# Patient Record
Sex: Male | Born: 1975 | Race: White | Hispanic: No | Marital: Single | Smoking: Former smoker
Health system: Southern US, Community
[De-identification: ages and names within clinical notes are randomized; demographics above are authoritative.]

## PROBLEM LIST (undated history)

## (undated) DIAGNOSIS — Z789 Other specified health status: Secondary | ICD-10-CM

## (undated) DIAGNOSIS — Z0282 Encounter for adoption services: Secondary | ICD-10-CM

## (undated) DIAGNOSIS — J45909 Unspecified asthma, uncomplicated: Secondary | ICD-10-CM

## (undated) DIAGNOSIS — K219 Gastro-esophageal reflux disease without esophagitis: Secondary | ICD-10-CM

## (undated) HISTORY — PX: MOUTH SURGERY: SHX715

---

## 2011-10-04 ENCOUNTER — Ambulatory Visit: Payer: Self-pay | Admitting: Family Medicine

## 2013-08-27 ENCOUNTER — Ambulatory Visit: Payer: Self-pay | Admitting: Family Medicine

## 2015-11-04 ENCOUNTER — Other Ambulatory Visit (HOSPITAL_COMMUNITY): Payer: Self-pay | Admitting: Physician Assistant

## 2015-11-04 ENCOUNTER — Ambulatory Visit
Admission: RE | Admit: 2015-11-04 | Discharge: 2015-11-04 | Disposition: A | Discharge status: Home / Self Care | Payer: BLUE CROSS/BLUE SHIELD | Source: Ambulatory Visit | Attending: Physician Assistant | Admitting: Physician Assistant

## 2015-11-04 ENCOUNTER — Other Ambulatory Visit: Payer: Self-pay | Admitting: Physician Assistant

## 2015-11-04 DIAGNOSIS — I861 Scrotal varices: Secondary | ICD-10-CM | POA: Diagnosis not present

## 2015-11-04 DIAGNOSIS — N5089 Other specified disorders of the male genital organs: Secondary | ICD-10-CM

## 2015-12-09 ENCOUNTER — Encounter
Admission: RE | Admit: 2015-12-09 | Discharge: 2015-12-09 | Disposition: A | Discharge status: Home / Self Care | Payer: BLUE CROSS/BLUE SHIELD | Source: Ambulatory Visit | Attending: Surgery | Admitting: Surgery

## 2015-12-09 HISTORY — DX: Unspecified asthma, uncomplicated: J45.909

## 2015-12-09 HISTORY — DX: Encounter for adoption services: Z02.82

## 2015-12-09 HISTORY — DX: Gastro-esophageal reflux disease without esophagitis: K21.9

## 2015-12-09 HISTORY — DX: Other specified health status: Z78.9

## 2015-12-09 NOTE — Patient Instructions (Signed)
  Your procedure is scheduled on: 12-16-15 Report to Same Day Surgery 2nd floor medical mall [facility](Medical Mall Entrance-take elevator on left to 2nd floor.  Check in with surgery information desk.) To find out your arrival time please call [phone] between 1PM - 3PM on 12-15-15  Remember: Instructions that are not followed completely may result in serious medical risk, up to and including death, or upon the discretion of your surgeon and anesthesiologist your surgery may need to be rescheduled.    _x___ 1. Do not eat food or drink liquids after midnight. No gum chewing or hard candies.     __x__ 2. No Alcohol for 24 hours before or after surgery.   __x__3. No Smoking for 24 prior to surgery.   ____  4. Bring all medications with you on the day of surgery if instructed.    __x__ 5. Notify your doctor if there is any change in your medical condition     (cold, fever, infections).     Do not wear jewelry, make-up, hairpins, clips or nail polish.  Do not wear lotions, powders, or perfumes. You may wear deodorant.  Do not shave 48 hours prior to surgery. Men may shave face and neck.  Do not bring valuables to the hospital.    [facility] is not responsible for any belongings or valuables.               Contacts, dentures or bridgework may not be worn into surgery.  Leave your suitcase in the car. After surgery it may be brought to your room.  For patients admitted to the hospital, discharge time is determined by your treatment team.   Patients discharged the day of surgery will not be allowed to drive home.  You will need someone to drive you home and stay with you the night of your procedure.    Please read over the following fact sheets that you were given:   [facility] Preparing for Surgery and or MRSA Information   ____ Take these medicines the morning of surgery with A SIP OF WATER:    1. NONE  2.  3.  4.  5.  6.  ____Fleets enema or Magnesium Citrate as directed.   ____  Use CHG Soap or sage wipes as directed on instruction sheet   ____ Use inhalers on the day of surgery and bring to hospital day of surgery  ____ Stop metformin 2 days prior to surgery    ____ Take 1/2 of usual insulin dose the night before surgery and none on the morning of surgery.   ____ Stop Aspirin, Coumadin, Pllavix ,Eliquis, Effient, or Pradaxa  x__ Stop Anti-inflammatories such as Advil, Aleve, Ibuprofen, Motrin, Naproxen,          Naprosyn, Goodies powders or aspirin products NOW-Ok to take Tylenol.   ____ Stop supplements until after surgery.    ____ Bring C-Pap to the hospital.

## 2015-12-16 ENCOUNTER — Ambulatory Visit: Payer: BLUE CROSS/BLUE SHIELD | Admitting: Anesthesiology

## 2015-12-16 ENCOUNTER — Encounter: Payer: Self-pay | Admitting: *Deleted

## 2015-12-16 ENCOUNTER — Encounter
Admission: RE | Disposition: A | Discharge status: Home / Self Care | Payer: Self-pay | Source: Ambulatory Visit | Attending: Surgery

## 2015-12-16 ENCOUNTER — Ambulatory Visit
Admission: RE | Admit: 2015-12-16 | Discharge: 2015-12-16 | Disposition: A | Discharge status: Home / Self Care | Payer: BLUE CROSS/BLUE SHIELD | Source: Ambulatory Visit | Attending: Surgery | Admitting: Surgery

## 2015-12-16 DIAGNOSIS — K436 Other and unspecified ventral hernia with obstruction, without gangrene: Secondary | ICD-10-CM | POA: Diagnosis present

## 2015-12-16 DIAGNOSIS — K219 Gastro-esophageal reflux disease without esophagitis: Secondary | ICD-10-CM | POA: Diagnosis not present

## 2015-12-16 DIAGNOSIS — Z6841 Body Mass Index (BMI) 40.0 and over, adult: Secondary | ICD-10-CM | POA: Insufficient documentation

## 2015-12-16 DIAGNOSIS — J45909 Unspecified asthma, uncomplicated: Secondary | ICD-10-CM | POA: Diagnosis not present

## 2015-12-16 DIAGNOSIS — F17210 Nicotine dependence, cigarettes, uncomplicated: Secondary | ICD-10-CM | POA: Diagnosis not present

## 2015-12-16 DIAGNOSIS — K439 Ventral hernia without obstruction or gangrene: Secondary | ICD-10-CM | POA: Insufficient documentation

## 2015-12-16 DIAGNOSIS — N432 Other hydrocele: Secondary | ICD-10-CM | POA: Insufficient documentation

## 2015-12-16 HISTORY — PX: VENTRAL HERNIA REPAIR: SHX424

## 2015-12-16 LAB — URINE DRUG SCREEN, QUALITATIVE (ARMC ONLY)
Amphetamines, Ur Screen: NOT DETECTED
Barbiturates, Ur Screen: NOT DETECTED
Benzodiazepine, Ur Scrn: NOT DETECTED
COCAINE METABOLITE, UR ~~LOC~~: NOT DETECTED
Cannabinoid 50 Ng, Ur ~~LOC~~: POSITIVE — AB
MDMA (ECSTASY) UR SCREEN: NOT DETECTED
Methadone Scn, Ur: NOT DETECTED
Opiate, Ur Screen: NOT DETECTED
Phencyclidine (PCP) Ur S: NOT DETECTED
TRICYCLIC, UR SCREEN: NOT DETECTED

## 2015-12-16 SURGERY — REPAIR, HERNIA, VENTRAL
Anesthesia: General | Wound class: Clean

## 2015-12-16 MED ORDER — SUCCINYLCHOLINE CHLORIDE 20 MG/ML IJ SOLN
INTRAMUSCULAR | Status: DC | PRN
  Administered 2015-12-16: 120 mg via INTRAVENOUS

## 2015-12-16 MED ORDER — LACTATED RINGERS IV SOLN
INTRAVENOUS | Status: DC
  Administered 2015-12-16: 08:00:00 via INTRAVENOUS

## 2015-12-16 MED ORDER — ROCURONIUM BROMIDE 100 MG/10ML IV SOLN
INTRAVENOUS | Status: DC | PRN
  Administered 2015-12-16: 30 mg via INTRAVENOUS
  Administered 2015-12-16: 20 mg via INTRAVENOUS

## 2015-12-16 MED ORDER — LIDOCAINE HCL (CARDIAC) 20 MG/ML IV SOLN
INTRAVENOUS | Status: DC | PRN
  Administered 2015-12-16: 100 mg via INTRAVENOUS

## 2015-12-16 MED ORDER — BUPIVACAINE-EPINEPHRINE 0.5% -1:200000 IJ SOLN
INTRAMUSCULAR | Status: DC | PRN
  Administered 2015-12-16: 17 mL

## 2015-12-16 MED ORDER — KETOROLAC TROMETHAMINE 30 MG/ML IJ SOLN
INTRAMUSCULAR | Status: DC | PRN
  Administered 2015-12-16: 30 mg via INTRAVENOUS

## 2015-12-16 MED ORDER — FENTANYL CITRATE (PF) 100 MCG/2ML IJ SOLN
INTRAMUSCULAR | Status: DC | PRN
  Administered 2015-12-16: 100 ug via INTRAVENOUS

## 2015-12-16 MED ORDER — FENTANYL CITRATE (PF) 100 MCG/2ML IJ SOLN
INTRAMUSCULAR | Status: AC
  Filled 2015-12-16: qty 2

## 2015-12-16 MED ORDER — HYDROCODONE-ACETAMINOPHEN 5-325 MG PO TABS: 1.0000 | ORAL_TABLET | ORAL | 0 refills | Status: DC | PRN

## 2015-12-16 MED ORDER — BUPIVACAINE HCL (PF) 0.5 % IJ SOLN
INTRAMUSCULAR | Status: AC
  Filled 2015-12-16: qty 30

## 2015-12-16 MED ORDER — SUGAMMADEX SODIUM 200 MG/2ML IV SOLN
INTRAVENOUS | Status: DC | PRN
  Administered 2015-12-16: 323 mg via INTRAVENOUS

## 2015-12-16 MED ORDER — DEXTROSE 5 % IV SOLN
3.0000 g | Freq: Once | INTRAVENOUS | Status: AC
  Administered 2015-12-16: 3 g via INTRAVENOUS
  Filled 2015-12-16: qty 3000

## 2015-12-16 MED ORDER — ONDANSETRON HCL 4 MG/2ML IJ SOLN
INTRAMUSCULAR | Status: DC | PRN
  Administered 2015-12-16: 4 mg via INTRAVENOUS

## 2015-12-16 MED ORDER — MIDAZOLAM HCL 2 MG/2ML IJ SOLN
INTRAMUSCULAR | Status: DC | PRN
  Administered 2015-12-16: 2 mg via INTRAVENOUS

## 2015-12-16 MED ORDER — EPINEPHRINE PF 1 MG/ML IJ SOLN
INTRAMUSCULAR | Status: AC
  Filled 2015-12-16: qty 1

## 2015-12-16 MED ORDER — FAMOTIDINE 20 MG PO TABS
ORAL_TABLET | ORAL | Status: AC
  Filled 2015-12-16: qty 1

## 2015-12-16 MED ORDER — HYDROCODONE-ACETAMINOPHEN 5-325 MG PO TABS: 1.0000 | ORAL_TABLET | ORAL | Status: DC | PRN

## 2015-12-16 MED ORDER — ONDANSETRON HCL 4 MG/2ML IJ SOLN: 4.0000 mg | Freq: Once | INTRAMUSCULAR | Status: DC | PRN

## 2015-12-16 MED ORDER — PROPOFOL 10 MG/ML IV BOLUS
INTRAVENOUS | Status: DC | PRN
  Administered 2015-12-16: 200 mg via INTRAVENOUS

## 2015-12-16 MED ORDER — FAMOTIDINE 20 MG PO TABS
20.0000 mg | ORAL_TABLET | Freq: Once | ORAL | Status: AC
  Administered 2015-12-16: 20 mg via ORAL

## 2015-12-16 MED ORDER — FENTANYL CITRATE (PF) 100 MCG/2ML IJ SOLN
25.0000 ug | INTRAMUSCULAR | Status: DC | PRN
  Administered 2015-12-16 (×2): 25 ug via INTRAVENOUS

## 2015-12-16 SURGICAL SUPPLY — 25 items
CANISTER SUCT 1200ML W/VALVE (MISCELLANEOUS) ×3 IMPLANT
CHLORAPREP W/TINT 26ML (MISCELLANEOUS) ×3 IMPLANT
DERMABOND ADVANCED (GAUZE/BANDAGES/DRESSINGS) ×2
DERMABOND ADVANCED .7 DNX12 (GAUZE/BANDAGES/DRESSINGS) ×1 IMPLANT
DRAPE LAPAROTOMY 100X77 ABD (DRAPES) ×3 IMPLANT
ELECT REM PT RETURN 9FT ADLT (ELECTROSURGICAL) ×3
ELECTRODE REM PT RTRN 9FT ADLT (ELECTROSURGICAL) ×1 IMPLANT
GAUZE SPONGE 4X4 12PLY STRL (GAUZE/BANDAGES/DRESSINGS) IMPLANT
GLOVE BIO SURGEON STRL SZ7.5 (GLOVE) ×3 IMPLANT
GOWN STRL REUS W/ TWL LRG LVL3 (GOWN DISPOSABLE) ×3 IMPLANT
GOWN STRL REUS W/TWL LRG LVL3 (GOWN DISPOSABLE) ×6
KIT RM TURNOVER STRD PROC AR (KITS) ×3 IMPLANT
LABEL OR SOLS (LABEL) ×3 IMPLANT
MESH SYNTHETIC 4X6 SOFT BARD (Mesh General) ×1 IMPLANT
MESH SYNTHETIC SOFT BARD 4X6 (Mesh General) ×2 IMPLANT
NEEDLE HYPO 25X1 1.5 SAFETY (NEEDLE) ×3 IMPLANT
NS IRRIG 500ML POUR BTL (IV SOLUTION) ×3 IMPLANT
PACK BASIN MINOR ARMC (MISCELLANEOUS) ×3 IMPLANT
STAPLER SKIN PROX 35W (STAPLE) IMPLANT
SUT CHROMIC 3 0 SH 27 (SUTURE) ×3 IMPLANT
SUT MNCRL 4-0 (SUTURE) ×2
SUT MNCRL 4-0 27XMFL (SUTURE) ×1
SUT SURGILON 0 30 BLK (SUTURE) ×9 IMPLANT
SUTURE MNCRL 4-0 27XMF (SUTURE) ×1 IMPLANT
SYRINGE 10CC LL (SYRINGE) ×3 IMPLANT

## 2015-12-16 NOTE — Anesthesia Preprocedure Evaluation (Signed)
Anesthesia Evaluation  Patient identified by MRN, date of birth, ID band Patient awake    Reviewed: Allergy & Precautions, NPO status , Patient's Chart, lab work & pertinent test results  History of Anesthesia Complications Negative for: history of anesthetic complications  Airway Mallampati: III       Dental   Pulmonary asthma (as a child) , former smoker,           Cardiovascular negative cardio ROS       Neuro/Psych negative neurological ROS     GI/Hepatic Neg liver ROS, GERD (occasional, not treated)  ,  Endo/Other  negative endocrine ROS  Renal/GU negative Renal ROS     Musculoskeletal   Abdominal   Peds  Hematology   Anesthesia Other Findings   Reproductive/Obstetrics                             Anesthesia Physical Anesthesia Plan  ASA: II  Anesthesia Plan: General   Post-op Pain Management:    Induction: Intravenous  Airway Management Planned: Oral ETT  Additional Equipment:   Intra-op Plan:   Post-operative Plan:   Informed Consent: I have reviewed the patients History and Physical, chart, labs and discussed the procedure including the risks, benefits and alternatives for the proposed anesthesia with the patient or authorized representative who has indicated his/her understanding and acceptance.     Plan Discussed with:   Anesthesia Plan Comments:         Anesthesia Quick Evaluation

## 2015-12-16 NOTE — Op Note (Signed)
OPERATIVE REPORT  PREOPERATIVE  DIAGNOSIS: . Ventral hernia  POSTOPERATIVE DIAGNOSIS: . Ventral hernia  PROCEDURE: . Ventral hernia repair  ANESTHESIA:  General  SURGEON: Renda RollsWilton Smith  MD   INDICATIONS: . He had recent pain in the lower epigastrium. A ventral hernia was demonstrated on physical exam and repair is recommended for definitive treatment.  With the patient on the operating table in the supine position under general anesthesia the epigastrium was clipped and prepared with ChloraPrep and draped in a sterile manner  A longitudinally oriented incision was made in the lower aspect of the epigastrium just above the umbilicus and carried down through subcutaneous tissues. Several small bleeding points were cauterized. A ventral hernia sac was encountered and was dissected free from surrounding structures and was approximately 4 cm in dimension. Dissection was carried down to the fascial ring defect which was approximately 1 cm in dimension. The fatty tissues within the hernia sac were incarcerated. It was necessary to enlarge the fascial defect by 5 mm on each side. Subsequently the hernia could be completely reduced. The properitoneal fat was dissected away from the fascia circumferentially. Bard soft mesh was cut to create an oval shape of 2 x 3 cm. This was placed into the properitoneal plane oriented transversely and sutured to the overlying fascia with through and through 0 Surgilon sutures. The fascial defect was then closed with a transversely oriented suture line of interrupted 0 Surgilon figure-of-eight sutures incorporating each suture into the mesh. The repair looked good. The deep fascia and subcutaneous tissues were infiltrated with half percent Sensorcaine with epinephrine. The subcutaneous tissues were closed with a 3-0 chromic pursestring suture. More superficial fatty tissues were  approximated with interrupted 3-0 chromic. The skin was closed with running 4-0 Monocryl  subcuticular suture and Dermabond.  The patient tolerated surgery satisfactorily and was then prepared for transfer to the recovery room  [facility] Smith M.D.

## 2015-12-16 NOTE — Transfer of Care (Signed)
Immediate Anesthesia Transfer of Care Note  Patient: Ricardo Bradshaw  Procedure(s) Performed: Procedure(s): HERNIA REPAIR VENTRAL ADULT (N/A)  Patient Location: PACU  Anesthesia Type:General  Level of Consciousness: awake  Airway & Oxygen Therapy: Patient Spontanous Breathing and Patient connected to face mask oxygen  Post-op Assessment: Report given to RN and Post -op Vital signs reviewed and stable  Post vital signs: Reviewed and stable  Last Vitals:  Vitals:   12/16/15 0741 12/16/15 0956  BP: 139/88 (P) 127/66  Pulse: 65   Resp: 18   Temp: 36.4 C (P) 36.3 C    Last Pain:  Vitals:   12/16/15 0741  TempSrc: Temporal         Complications: No apparent anesthesia complications

## 2015-12-16 NOTE — H&P (Signed)
  He  Reports no change in condition since office exam.  Site of hernia identified on exam.  Labs noted  Discussed plan for ventral hernia repair

## 2015-12-16 NOTE — Discharge Instructions (Addendum)
AMBULATORY SURGERY  DISCHARGE INSTRUCTIONS   1) The drugs that you were given will stay in your system until tomorrow so for the next 24 hours you should not:  A) Drive an automobile B) Make any legal decisions C) Drink any alcoholic beverage   2) You may resume regular meals tomorrow.  Today it is better to start with liquids and gradually work up to solid foods.  You may eat anything you prefer, but it is better to start with liquids, then soup and crackers, and gradually work up to solid foods.   3) Please notify your doctor immediately if you have any unusual bleeding, trouble breathing, redness and pain at the surgery site, drainage, fever, or pain not relieved by medication.    4) Additional Instructions:        Please contact your physician with any problems or Same Day Surgery at [phone], Monday through Friday 6 am to 4 pm, or [place] at [facility] Main number at [phone].     Take Tylenol or Norco if needed for pain.  May also take ibuprofen if needed.  Should not drive anything dangerous when taking Norco.  May shower.  Avoid straining and heavy lifting.

## 2015-12-16 NOTE — Anesthesia Postprocedure Evaluation (Signed)
Anesthesia Post Note  Patient: Ricardo Bradshaw P Spanos  Procedure(s) Performed: Procedure(s) (LRB): HERNIA REPAIR VENTRAL ADULT (N/A)  Patient location during evaluation: PACU Anesthesia Type: General Level of consciousness: awake and alert Pain management: pain level controlled Vital Signs Assessment: post-procedure vital signs reviewed and stable Respiratory status: spontaneous breathing and respiratory function stable Cardiovascular status: stable Anesthetic complications: no    Last Vitals:  Vitals:   12/16/15 0741 12/16/15 0956  BP: 139/88 127/66  Pulse: 65 87  Resp: 18 16  Temp: 36.4 C 36.3 C    Last Pain:  Vitals:   12/16/15 0956  TempSrc:   PainSc: 6                  Chasmine Lender K

## 2015-12-16 NOTE — Anesthesia Procedure Notes (Signed)
Procedure Name: Intubation Performed by: Khalidah Herbold Pre-anesthesia Checklist: Patient identified, Patient being monitored, Timeout performed, Emergency Drugs available and Suction available Patient Re-evaluated:Patient Re-evaluated prior to inductionOxygen Delivery Method: Circle system utilized Preoxygenation: Pre-oxygenation with 100% oxygen Intubation Type: IV induction Ventilation: Mask ventilation without difficulty Laryngoscope Size: Mac and 3 Grade View: Grade I Tube type: Oral Tube size: 7.5 mm Number of attempts: 1 Airway Equipment and Method: Stylet Placement Confirmation: ETT inserted through vocal cords under direct vision,  positive ETCO2 and breath sounds checked- equal and bilateral Secured at: 23 cm Tube secured with: Tape Dental Injury: Teeth and Oropharynx as per pre-operative assessment

## 2016-07-21 IMAGING — US US SCROTUM W/ DOPPLER COMPLETE
1 series · 13 of 25 positions shown · non-contrast
Comparison: None

CLINICAL DATA: LEFT scrotal swelling for 2 months

EXAM:
SCROTAL ULTRASOUND
DOPPLER ULTRASOUND OF THE TESTICLES
TECHNIQUE: Complete ultrasound examination of the testicles, epididymis, and
other scrotal structures was performed. Color and spectral Doppler
ultrasound were also utilized to evaluate blood flow to the
testicles.

[Series 1: us scrotum w/ doppler complete · 0.08mm/px · 13 of 75 slices shown]
[im 1/75]
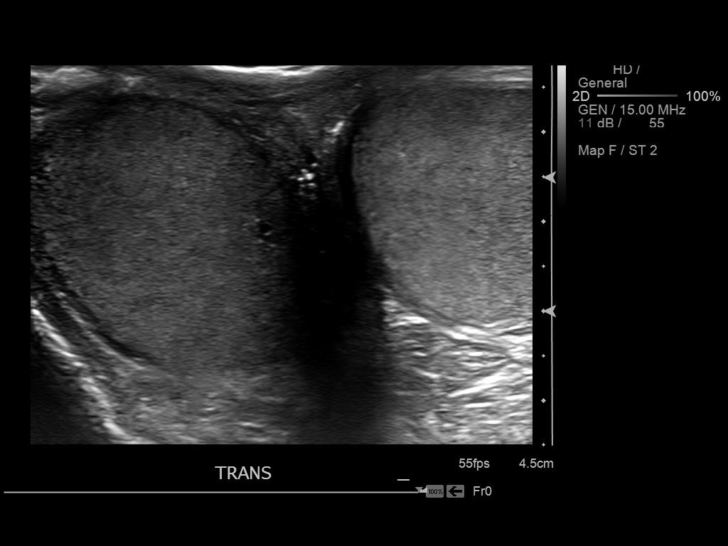
[im 7/75]
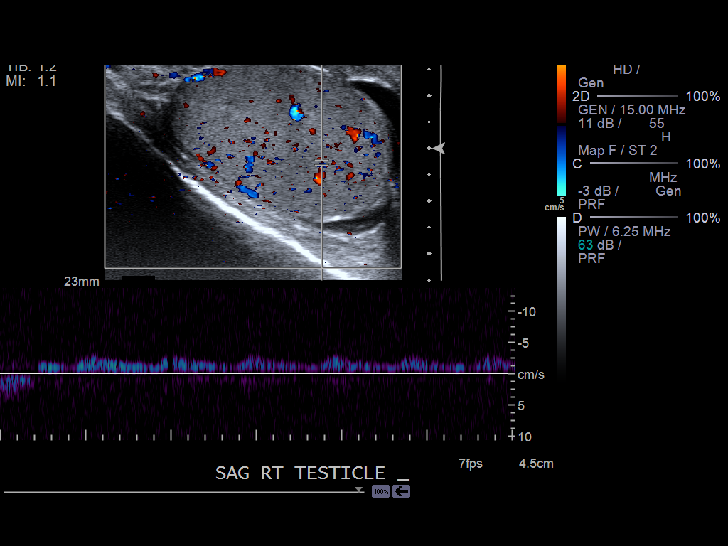
[im 13/75]
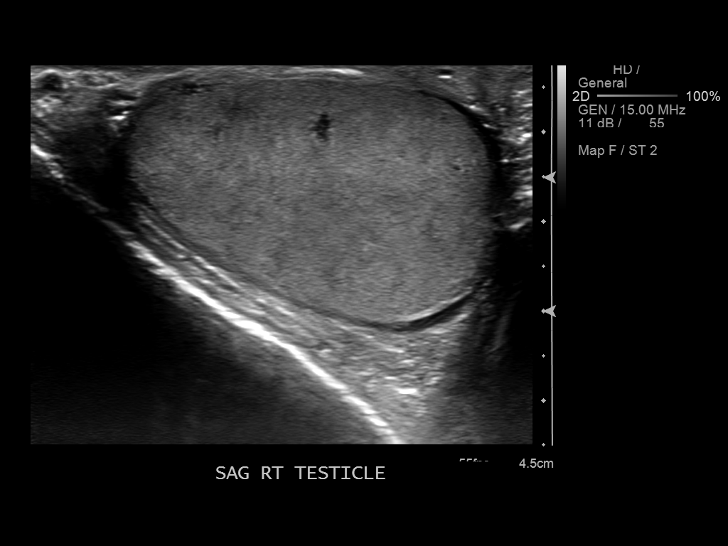
[im 19/75]
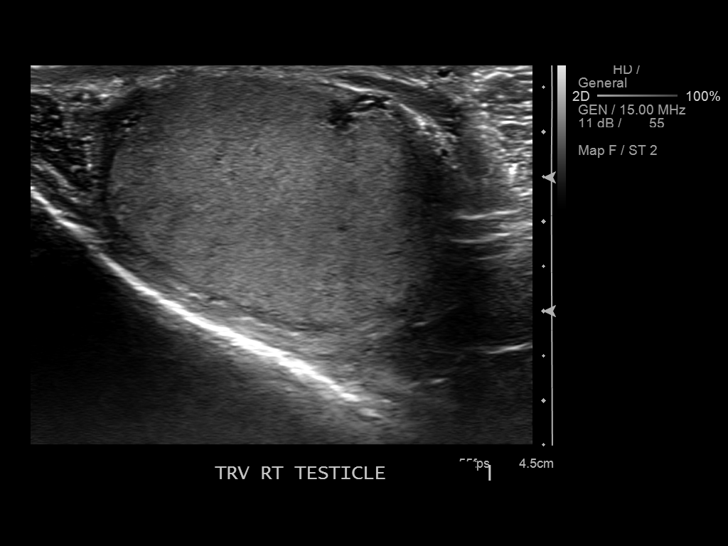
[im 25/75]
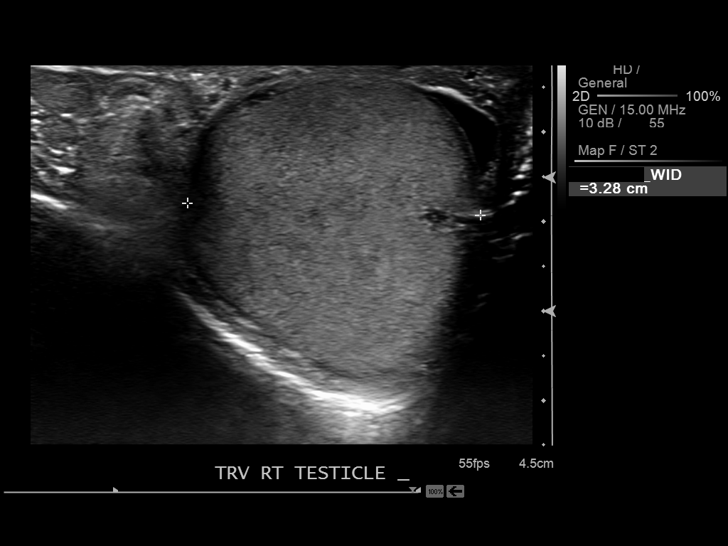
[im 31/75]
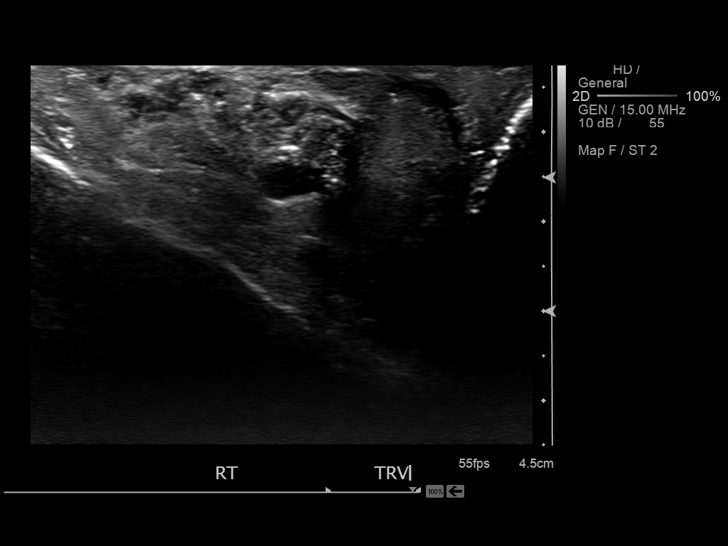
[im 38/75]
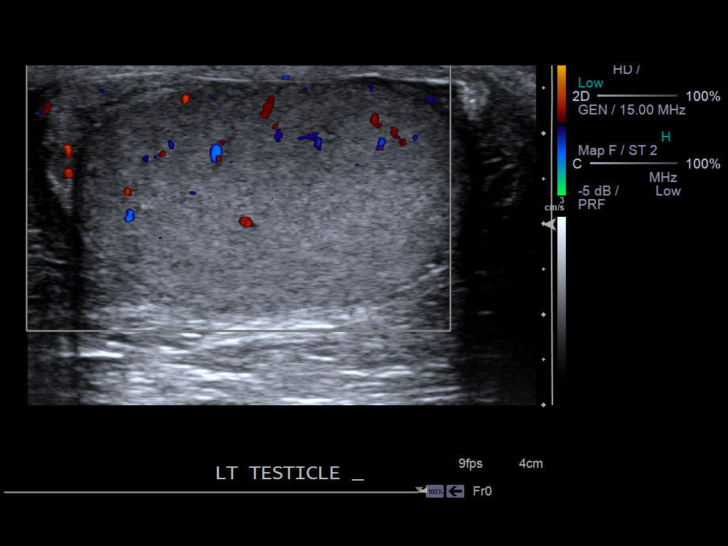
[im 44/75]
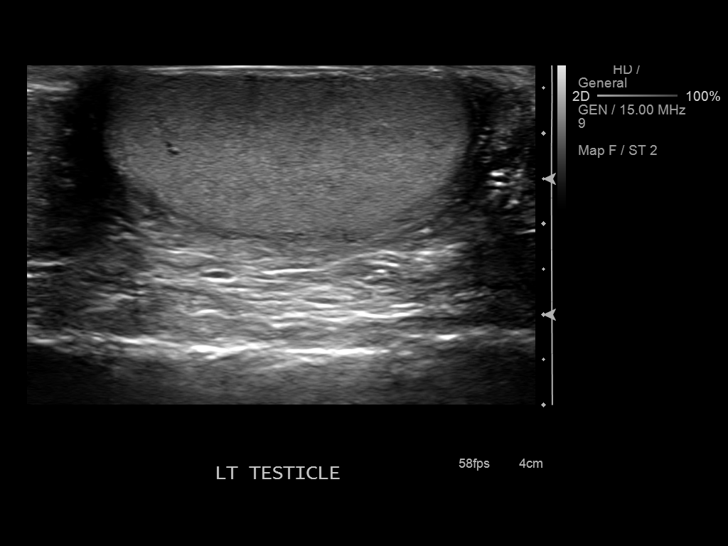
[im 50/75]
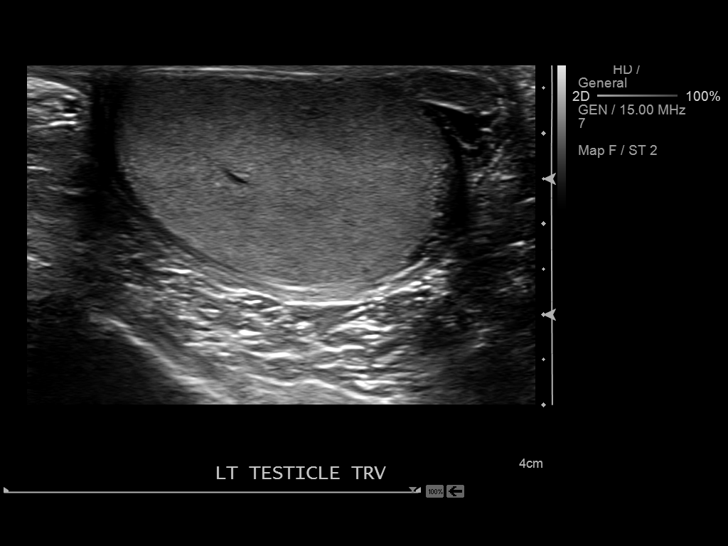
[im 56/75]
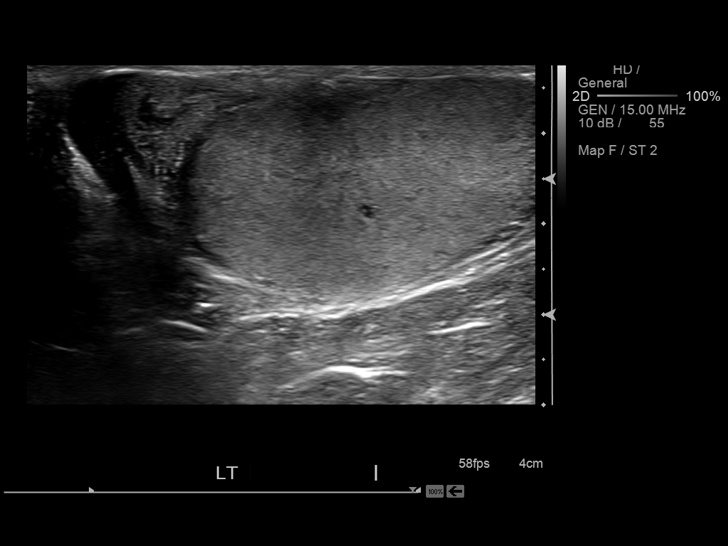
[im 62/75]
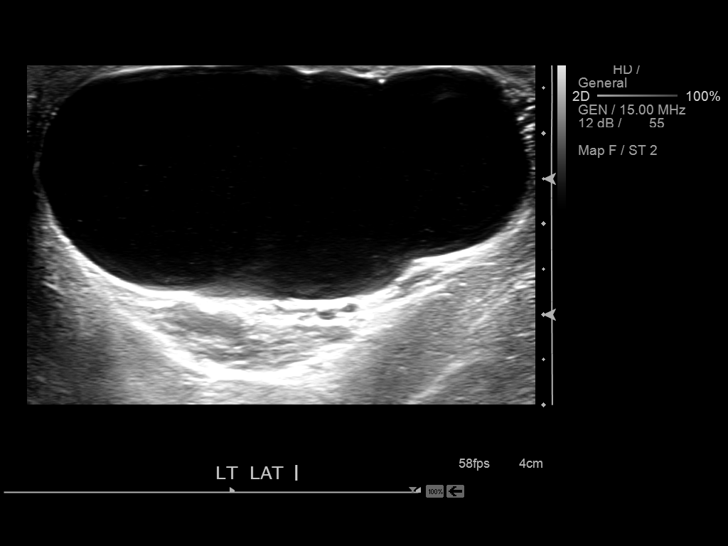
[im 68/75]
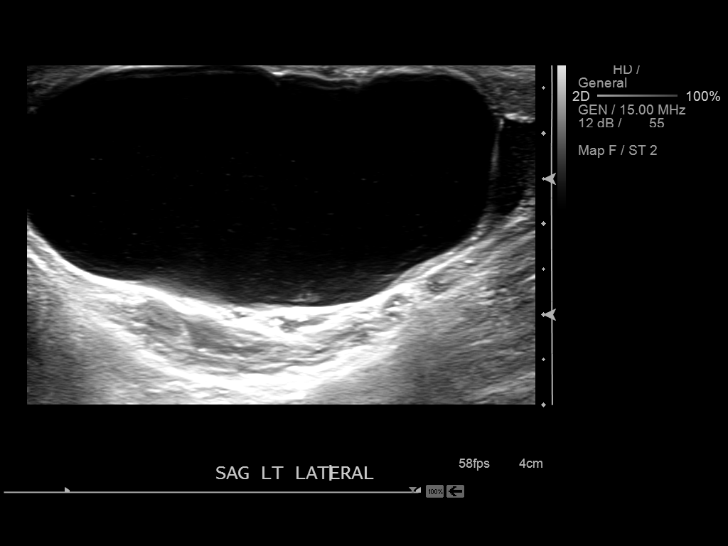
[im 75/75]
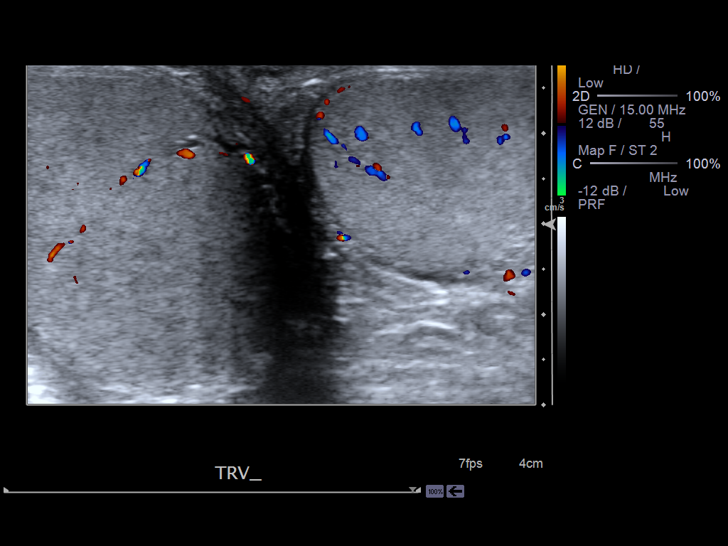

[13 of 25 positions shown; findings below may reference images not displayed]

FINDINGS: Right testicle

Measurements: 4.4 x 2.7 x 3.3 cm. Normal echogenicity without mass
or calcification. Internal blood flow present on color Doppler
imaging.

Left testicle

Measurements: 4.3 x 2.5 x 3.6 cm. Normal echogenicity without mass
or calcification. Internal blood flow present on color Doppler
imaging, symmetric with RIGHT.

Right epididymis:  Small cyst RIGHT epididymal head 9 x 6 x 5 mm.

Left epididymis: Head epididymis normal. Body and tail LEFT
epididymis not visualized.

Hydrocele: Large cystic structure identified lateral to LEFT testis,
5.4 x 2.4 x 4.5 cm, question loculated LEFT hydrocele versus large
epididymal cyst/spermatocele replacing the body and tail of the LEFT
epididymis. No RIGHT hydrocele.

Varicocele:  None visualized.

Pulsed Doppler interrogation of both testes demonstrates low
resistance arterial and venous waveforms bilaterally.
IMPRESSION: Normal appearing testes.

Large cyst in LEFT hemi scrotum 5.4 x 2.4 x 4.5 cm, of uncertain
etiology; this could represent a large cyst replacing the body and
tail of the LEFT epididymis, question epididymal cyst versus
spermatocele, or a loculated LEFT hydrocele.

## 2018-07-04 ENCOUNTER — Other Ambulatory Visit: Payer: BLUE CROSS/BLUE SHIELD

## 2018-07-04 ENCOUNTER — Telehealth: Payer: Self-pay

## 2018-07-04 DIAGNOSIS — Z20822 Contact with and (suspected) exposure to covid-19: Secondary | ICD-10-CM

## 2018-07-04 NOTE — Telephone Encounter (Signed)
Pt. Schedule for today.

## 2018-07-04 NOTE — Telephone Encounter (Signed)
Ricardo Bradshaw with [facility] Dept. Requests COVID 19 test for symptoms. Message left to call back and schedule.

## 2018-07-07 ENCOUNTER — Telehealth: Payer: Self-pay

## 2018-07-07 NOTE — Telephone Encounter (Signed)
Pt. Checking on COVID 19 results - not available. 

## 2018-07-08 ENCOUNTER — Emergency Department: Payer: BC Managed Care – PPO

## 2018-07-08 ENCOUNTER — Encounter (HOSPITAL_COMMUNITY): Payer: Self-pay | Admitting: Family Medicine

## 2018-07-08 ENCOUNTER — Telehealth: Payer: Self-pay

## 2018-07-08 ENCOUNTER — Emergency Department
Admission: EM | Admit: 2018-07-08 | Discharge: 2018-07-08 | Disposition: A | Discharge status: Other Acute Inpatient Hospital | Payer: BC Managed Care – PPO | Attending: Emergency Medicine | Admitting: Emergency Medicine

## 2018-07-08 ENCOUNTER — Inpatient Hospital Stay (HOSPITAL_COMMUNITY)
Admission: AD | Admit: 2018-07-08 | Discharge: 2018-07-10 | DRG: 177 | Disposition: A | Discharge status: Home / Self Care | Payer: BC Managed Care – PPO | Source: Other Acute Inpatient Hospital | Attending: Internal Medicine | Admitting: Internal Medicine

## 2018-07-08 ENCOUNTER — Encounter: Payer: Self-pay | Admitting: *Deleted

## 2018-07-08 ENCOUNTER — Other Ambulatory Visit: Payer: Self-pay

## 2018-07-08 DIAGNOSIS — U071 COVID-19: Secondary | ICD-10-CM | POA: Diagnosis present

## 2018-07-08 DIAGNOSIS — J9601 Acute respiratory failure with hypoxia: Secondary | ICD-10-CM

## 2018-07-08 DIAGNOSIS — J45909 Unspecified asthma, uncomplicated: Secondary | ICD-10-CM | POA: Diagnosis not present

## 2018-07-08 DIAGNOSIS — Z6841 Body Mass Index (BMI) 40.0 and over, adult: Secondary | ICD-10-CM

## 2018-07-08 DIAGNOSIS — J441 Chronic obstructive pulmonary disease with (acute) exacerbation: Secondary | ICD-10-CM | POA: Diagnosis not present

## 2018-07-08 DIAGNOSIS — J069 Acute upper respiratory infection, unspecified: Secondary | ICD-10-CM | POA: Diagnosis not present

## 2018-07-08 DIAGNOSIS — K219 Gastro-esophageal reflux disease without esophagitis: Secondary | ICD-10-CM | POA: Diagnosis present

## 2018-07-08 DIAGNOSIS — Z87891 Personal history of nicotine dependence: Secondary | ICD-10-CM | POA: Diagnosis not present

## 2018-07-08 DIAGNOSIS — R0602 Shortness of breath: Secondary | ICD-10-CM | POA: Diagnosis present

## 2018-07-08 LAB — CBC WITH DIFFERENTIAL/PLATELET
Abs Immature Granulocytes: 0.03 10*3/uL (ref 0.00–0.07)
Basophils Absolute: 0 10*3/uL (ref 0.0–0.1)
Basophils Relative: 0 %
Eosinophils Absolute: 0 10*3/uL (ref 0.0–0.5)
Eosinophils Relative: 0 %
HCT: 45.7 % (ref 39.0–52.0)
Hemoglobin: 15.8 g/dL (ref 13.0–17.0)
Immature Granulocytes: 1 %
Lymphocytes Relative: 17 %
Lymphs Abs: 0.9 10*3/uL (ref 0.7–4.0)
MCH: 31.9 pg (ref 26.0–34.0)
MCHC: 34.6 g/dL (ref 30.0–36.0)
MCV: 92.3 fL (ref 80.0–100.0)
Monocytes Absolute: 0.4 10*3/uL (ref 0.1–1.0)
Monocytes Relative: 7 %
Neutro Abs: 4 10*3/uL (ref 1.7–7.7)
Neutrophils Relative %: 75 %
Platelets: 132 10*3/uL — ABNORMAL LOW (ref 150–400)
RBC: 4.95 MIL/uL (ref 4.22–5.81)
RDW: 12.5 % (ref 11.5–15.5)
WBC: 5.4 10*3/uL (ref 4.0–10.5)
nRBC: 0 % (ref 0.0–0.2)

## 2018-07-08 LAB — FERRITIN: Ferritin: 1475 ng/mL — ABNORMAL HIGH (ref 24–336)

## 2018-07-08 LAB — TRIGLYCERIDES: Triglycerides: 76 mg/dL (ref <150)

## 2018-07-08 LAB — COMPREHENSIVE METABOLIC PANEL
ALT: 32 U/L (ref 0–44)
AST: 31 U/L (ref 15–41)
Albumin: 3.9 g/dL (ref 3.5–5.0)
Alkaline Phosphatase: 60 U/L (ref 38–126)
Anion gap: 12 (ref 5–15)
BUN: 12 mg/dL (ref 6–20)
CO2: 23 mmol/L (ref 22–32)
Calcium: 8.7 mg/dL — ABNORMAL LOW (ref 8.9–10.3)
Chloride: 100 mmol/L (ref 98–111)
Creatinine, Ser: 0.69 mg/dL (ref 0.61–1.24)
GFR calc Af Amer: >60 mL/min (ref >60)
GFR calc non Af Amer: >60 mL/min (ref >60)
Glucose, Bld: 119 mg/dL — ABNORMAL HIGH (ref 70–99)
Potassium: 3.7 mmol/L (ref 3.5–5.1)
Sodium: 135 mmol/L (ref 135–145)
Total Bilirubin: 1.8 mg/dL — ABNORMAL HIGH (ref 0.3–1.2)
Total Protein: 7.7 g/dL (ref 6.5–8.1)

## 2018-07-08 LAB — FIBRINOGEN: Fibrinogen: >750 mg/dL — ABNORMAL HIGH (ref 210–475)

## 2018-07-08 LAB — NOVEL CORONAVIRUS, NAA: SARS-CoV-2, NAA: DETECTED — AB

## 2018-07-08 LAB — LACTIC ACID, PLASMA: Lactic Acid, Venous: 1.3 mmol/L (ref 0.5–1.9)

## 2018-07-08 LAB — PROCALCITONIN: Procalcitonin: <0.1 ng/mL

## 2018-07-08 LAB — C-REACTIVE PROTEIN: CRP: 5.9 mg/dL — ABNORMAL HIGH (ref <1.0)

## 2018-07-08 LAB — LACTATE DEHYDROGENASE: LDH: 315 U/L — ABNORMAL HIGH (ref 98–192)

## 2018-07-08 LAB — FIBRIN DERIVATIVES D-DIMER (ARMC ONLY): Fibrin derivatives D-dimer (ARMC): 1038.48 ng/mL (FEU) — ABNORMAL HIGH (ref 0.00–499.00)

## 2018-07-08 MED ORDER — ONDANSETRON HCL 4 MG/2ML IJ SOLN: 4.0000 mg | Freq: Four times a day (QID) | INTRAMUSCULAR | Status: DC | PRN

## 2018-07-08 MED ORDER — ONDANSETRON HCL 4 MG PO TABS: 4.0000 mg | ORAL_TABLET | Freq: Four times a day (QID) | ORAL | Status: DC | PRN

## 2018-07-08 MED ORDER — ZINC SULFATE 220 (50 ZN) MG PO CAPS
220.0000 mg | ORAL_CAPSULE | Freq: Every day | ORAL | Status: DC
  Administered 2018-07-08 – 2018-07-10 (×3): 220 mg via ORAL
  Filled 2018-07-08 (×3): qty 1

## 2018-07-08 MED ORDER — ACETAMINOPHEN 325 MG PO TABS
650.0000 mg | ORAL_TABLET | Freq: Four times a day (QID) | ORAL | Status: DC | PRN
  Administered 2018-07-09 – 2018-07-10 (×2): 650 mg via ORAL
  Filled 2018-07-08 (×2): qty 2

## 2018-07-08 MED ORDER — ENOXAPARIN SODIUM 80 MG/0.8ML ~~LOC~~ SOLN
80.0000 mg | SUBCUTANEOUS | Status: DC
  Administered 2018-07-08 – 2018-07-09 (×2): 80 mg via SUBCUTANEOUS
  Filled 2018-07-08 (×2): qty 0.8

## 2018-07-08 MED ORDER — GUAIFENESIN-DM 100-10 MG/5ML PO SYRP: 10.0000 mL | ORAL_SOLUTION | ORAL | Status: DC | PRN

## 2018-07-08 MED ORDER — ALBUTEROL SULFATE HFA 108 (90 BASE) MCG/ACT IN AERS
2.0000 | INHALATION_SPRAY | RESPIRATORY_TRACT | Status: DC | PRN
  Filled 2018-07-08: qty 6.7

## 2018-07-08 MED ORDER — HYDROCOD POLST-CPM POLST ER 10-8 MG/5ML PO SUER: 5.0000 mL | Freq: Two times a day (BID) | ORAL | Status: DC | PRN

## 2018-07-08 MED ORDER — METHYLPREDNISOLONE SODIUM SUCC 125 MG IJ SOLR
125.0000 mg | Freq: Once | INTRAMUSCULAR | Status: AC
  Administered 2018-07-08: 15:00:00 125 mg via INTRAVENOUS
  Filled 2018-07-08: qty 2

## 2018-07-08 MED ORDER — VITAMIN C 500 MG PO TABS
500.0000 mg | ORAL_TABLET | Freq: Every day | ORAL | Status: DC
  Administered 2018-07-08 – 2018-07-10 (×3): 500 mg via ORAL
  Filled 2018-07-08 (×3): qty 1

## 2018-07-08 NOTE — ED Triage Notes (Signed)
Pt to ED for increasing SOB. COVID positive. Decreased appetite.   Pt able to ambulate to treatment room without increased WOB noted. Oxygen saturation 91% on RA.

## 2018-07-08 NOTE — ED Provider Notes (Signed)
[facility] Emergency Department Provider Note   ____________________________________________    I have reviewed the triage vital signs and the nursing notes.   HISTORY  Chief Complaint Shortness of Breath     HPI Ricardo Bradshaw is a 43 y.o. male who was diagnosed with COVID-19, swabbed on Friday, symptoms started on Thursday who presents with worsening shortness of breath.  Patient reports shortness of breath started over the weekend and has steadily worsened.  He reports feeling extremely winded with any exertion.  Notes that he is feeling somewhat better now after being on oxygen for an hour.  Has had cold chills and sweats throughout the weekend, headache and myalgias.  Feels that he may have contracted coronavirus at work.  Past Medical History:  Diagnosis Date  . Adopted   . Asthma    AS A CHILD-NO INHALERS  . GERD (gastroesophageal reflux disease)    NO MEDS    There are no active problems to display for this patient.   Past Surgical History:  Procedure Laterality Date  . MOUTH SURGERY     WISDOM TEETH  . VENTRAL HERNIA REPAIR N/A 12/16/2015   Procedure: HERNIA REPAIR VENTRAL ADULT;  Surgeon: Leonie Green, MD;  Location: [facility] ORS;  Service: General;  Laterality: N/A;    Prior to Admission medications   Not on File     Allergies Pollen extract  History reviewed. No pertinent family history.  Social History Social History   Tobacco Use  . Smoking status: Former Smoker    Packs/day: 0.50    Years: 13.00    Pack years: 6.50    Types: Cigarettes    Quit date: 12/09/2014    Years since quitting: 3.5  . Smokeless tobacco: Never Used  Substance Use Topics  . Alcohol use: Yes    Comment: OCC  . Drug use: Yes    Types: Marijuana    Comment: OCC    Review of Systems  Constitutional: No fever/chills Eyes: No visual changes.  ENT: No sore throat. Cardiovascular: Mild chest discomfort Respiratory: As above  Gastrointestinal: No abdominal pain.  Genitourinary: Negative for dysuria. Musculoskeletal: Myalgias Skin: Negative for rash. Neurological: Negative for headaches or weakness   ____________________________________________   PHYSICAL EXAM:  VITAL SIGNS: ED Triage Vitals  Enc Vitals Group     BP 07/08/18 1248 134/86     Pulse Rate 07/08/18 1248 (!) 102     Resp 07/08/18 1248 (!) 21     Temp 07/08/18 1248 99.8 F (37.7 C)     Temp Source 07/08/18 1248 Oral     SpO2 07/08/18 1248 95 %     Weight 07/08/18 1245 (!) 158.8 kg (350 lb)     Height 07/08/18 1245 1.778 m (5\' 10" )     Head Circumference --      Peak Flow --      Pain Score 07/08/18 1245 9     Pain Loc --      Pain Edu? --      Excl. in [place]? --     Constitutional: Alert and oriented.   Nose: No congestion/rhinnorhea. Mouth/Throat: Mucous membranes are moist.   Neck:  Painless ROM Cardiovascular: Normal rate, regular rhythm. Grossly normal heart sounds.   Respiratory: Mild tachypnea on arrival.  No retractions. Lungs CTAB. Gastrointestinal: Soft and nontender. No distention.  No CVA tenderness.  Musculoskeletal:   Warm and well perfused Neurologic:  Normal speech and language. No gross focal neurologic  deficits are appreciated.  Skin:  Skin is warm, dry and intact. No rash noted. Psychiatric: Mood and affect are normal. Speech and behavior are normal.  ____________________________________________   LABS (all labs ordered are listed, but only abnormal results are displayed)  Labs Reviewed  CBC WITH DIFFERENTIAL/PLATELET - Abnormal; Notable for the following components:      Result Value   Platelets 132 (*)    All other components within normal limits  FIBRIN DERIVATIVES D-DIMER ([facility] ONLY) - Abnormal; Notable for the following components:   Fibrin derivatives D-dimer ([facility]) 1,038.48 (*)    All other components within normal limits  CULTURE, BLOOD (ROUTINE X 2)  CULTURE, BLOOD (ROUTINE X 2)  LACTIC ACID,  PLASMA  LACTIC ACID, PLASMA  COMPREHENSIVE METABOLIC PANEL  PROCALCITONIN  LACTATE DEHYDROGENASE  FERRITIN  TRIGLYCERIDES  FIBRINOGEN  C-REACTIVE PROTEIN   ____________________________________________  EKG  None ____________________________________________  RADIOLOGY  Chest x-ray demonstrates mild opacities periphery of the left lung ____________________________________________   PROCEDURES  Procedure(s) performed: No  Procedures   Critical Care performed: yes  CRITICAL CARE Performed by: Jene Everyobert Vivian Neuwirth   Total critical care time: 30 minutes  Critical care time was exclusive of separately billable procedures and treating other patients.  Critical care was necessary to treat or prevent imminent or life-threatening deterioration.  Critical care was time spent personally by me on the following activities: development of treatment plan with patient and/or surrogate as well as nursing, discussions with consultants, evaluation of patient's response to treatment, examination of patient, obtaining history from patient or surrogate, ordering and performing treatments and interventions, ordering and review of laboratory studies, ordering and review of radiographic studies, pulse oximetry and re-evaluation of patient's condition.  ____________________________________________   INITIAL IMPRESSION / ASSESSMENT AND PLAN / ED COURSE  Pertinent labs & imaging results that were available during my care of the patient were reviewed by me and considered in my medical decision making (see chart for details).  Patient's room air pulse oximetry 88% reportedly.  On nasal cannula oxygen feeling much better.  Chest x-ray demonstrates possible opacities periphery of the lung.  Positive COVID test given his oxygen requirement [facility]    ____________________________________________   FINAL CLINICAL IMPRESSION(S) / ED DIAGNOSES  Final  diagnoses:  COPD exacerbation (HCC)  COVID-19  Acute respiratory failure with hypoxia (HCC)        Note:  This document was prepared using Dragon voice recognition software and may include unintentional dictation errors.   Jene EveryKinner, Shakinah Navis, MD 07/08/18 [phone]

## 2018-07-08 NOTE — H&P (Signed)
History and Physical   Ricardo Bradshaw QPY:[id number] DOB: 1975/04/25 DOA: 07/08/2018  Referring MD/NP/PA: Dr. Corky Downs, [place] PCP: Dion Body, MD  Patient coming from: Home  Chief Complaint: Shortness of breath  HPI: Ricardo Bradshaw is a 43 y.o. male with a history of obesity and childhood asthma who presented with shortness of breath worsening over the previous 24 hours in the setting of having a covid positive test on 6/26. He first felt dyspnea on exertion after mowing his lawn 6/23 and later found out his coworker tested positive for covid. He sought a test 6/26 and was notified of results today. He's felt some chills, mild wheezing and shortness of breath only with exertion. No chest pain. Does not report a significant cough.   ED Course: ED physician reports that the patient was hypoxic to 88% on room air and required 2L by [place] with ongoing exertional dyspnea. Noted mild CXR infiltrates. Procalcitonin undetectable, lactic acid normal, but acute phase reactants elevated. Blood cultures were drawn and admission at [facility] was requested.  Review of Systems: +intermittent headache, none currently. No nausea, vomiting, abd pain, chest pain, palpitations, leg swelling, and per HPI. All others reviewed and are negative.   Past Medical History:  Diagnosis Date  . Adopted   . Asthma    AS A CHILD-NO INHALERS  . GERD (gastroesophageal reflux disease)    NO MEDS   Past Surgical History:  Procedure Laterality Date  . MOUTH SURGERY     WISDOM TEETH  . VENTRAL HERNIA REPAIR N/A 12/16/2015   Procedure: HERNIA REPAIR VENTRAL ADULT;  Surgeon: Leonie Green, MD;  Location: [facility] ORS;  Service: General;  Laterality: N/A;   - Former smoker ~1/2-3/4ppd x20 years, quite about 5 years ago, drinks 3-4 beers per night about 3 nights per week on average, never had withdrawal symptoms. Lives with brother and father, works Scientist, research (medical) at [employer]. - Family history unknown as he is adopted,  otherwise reviewed and not pertinent. - Meds: None - Allergies: Seasonal only, NKDA.   Physical Exam: There were no vitals filed for this visit. Constitutional: Obese male in no distress, calm demeanor Eyes: Lids and conjunctivae normal, PERRL ENMT: Mucous membranes are moist. Posterior pharynx clear of any exudate or lesions. Fair dentition.  Neck: normal, supple, no masses, no thyromegaly Respiratory: Non-labored breathing 2L [place] without accessory muscle use. Clear breath sounds to auscultation bilaterally Cardiovascular: Regular rate and rhythm, no murmurs, rubs, or gallops. No carotid bruits. No JVD. No LE edema. Palpable pedal pulses. Abdomen: Normoactive bowel sounds. No tenderness, non-distended, and no masses palpated. No hepatosplenomegaly. GU: No indwelling catheter Musculoskeletal: No clubbing / cyanosis. No joint deformity upper and lower extremities. Good ROM, no contractures. Normal muscle tone.  Skin: Warm, dry. No rashes, wounds, or ulcers. No significant lesions noted.  Neurologic: CN II-XII grossly intact. Speech normal. No focal deficits in motor strength or sensation in all extremities.  Psychiatric: Alert and oriented x3. Normal judgment and insight. Mood euthymic with congruent affect.   Labs on Admission: I have personally reviewed following labs and imaging studies  CBC: Recent Labs  Lab 07/08/18 1340  WBC 5.4  NEUTROABS 4.0  HGB 15.8  HCT 45.7  MCV 92.3  PLT 124*   Basic Metabolic Panel: Recent Labs  Lab 07/08/18 1340  NA 135  K 3.7  CL 100  CO2 23  GLUCOSE 119*  BUN 12  CREATININE 0.69  CALCIUM 8.7*   GFR: Estimated Creatinine Clearance: 180.7  mL/min (by C-G formula based on SCr of 0.69 mg/dL). Liver Function Tests: Recent Labs  Lab 07/08/18 1340  AST 31  ALT 32  ALKPHOS 60  BILITOT 1.8*  PROT 7.7  ALBUMIN 3.9   Anemia Panel: Recent Labs    07/08/18 1340  FERRITIN 1,475*    Recent Results (from the past 240 hour(s))  Novel  Coronavirus, NAA ([facility])     Status: Abnormal   Collection Time: 07/04/18 10:13 AM  Result Value Ref Range Status   SARS-CoV-2, NAA Detected (A) Not Detected Final    Comment: Testing was performed using the cobas(R) SARS-CoV-2 test. This test was developed and its performance characteristics determined by [facility]. This test has not been FDA cleared or approved. This test has been authorized by FDA under an Emergency Use Authorization (EUA). This test is only authorized for the duration of time the declaration that circumstances exist justifying the authorization of the emergency use of in vitro diagnostic tests for detection of SARS-CoV-2 virus and/or diagnosis of COVID-19 infection under section 564(b)(1) of the Act, 21 U.S.C. 657QIO-9(G)(2360bbb-3(b)(1), unless the authorization is terminated or revoked sooner. When diagnostic testing is negative, the possibility of a false negative result should be considered in the context of a patient's recent exposures and the presence of clinical signs and symptoms consistent with COVID-19. An individual without symptoms of COVID-19 and who is not shedding SARS-CoV-2 virus would expect to have a negati ve (not detected) result in this assay.      Radiological Exams on Admission: Dg Chest Port 1 View  Result Date: 07/08/2018 CLINICAL DATA:  Increasing shortness of breath.  COVID-19 positive. EXAM: PORTABLE CHEST 1 VIEW COMPARISON:  None. FINDINGS: The study is limited due to the low volume portable technique. The heart, hila, and mediastinum are normal. No pneumothorax. Suspected mild opacities in the periphery of the left lung. The right lung is clear. No other acute abnormalities. IMPRESSION: Suspected mild opacities in the periphery of the left lung. No other abnormalities. Limited study due to portable technique. Electronically Signed   By: Gerome Samavid  Williams III M.D   On: 07/08/2018 13:36   Assessment/Plan Active Problems:   Acute  respiratory failure with hypoxia (HCC)   Acute respiratory disease due to COVID-19 virus   Morbid obesity (HCC)   Acute hypoxic respiratory failure due to covid-19:  - Continue IV steroids. Monitor clinically. Remdesivir expected to be available 7/1 at which time decision could be made whether this is indicated. - Continue airborne, contact precautions. PPE including surgical gown, gloves, cap, shoe covers, and CAPR used during this encounter in a negative pressure room.  - Check daily labs: CBC w/diff, CMP, d-dimer, ferritin, CRP - Enoxaparin prophylactic dose. D-dimer minimally elevated. - Blood cultures drawn, pending. PCT neg, no indication for antibiotics.  - Maintain euvolemia/net negative.  - Avoid NSAIDs - Recommend proning and aggressive use of incentive spirometry.  DVT prophylaxis: Lovenox 80mg  q24h  Code Status: Full  Family Communication: None at bedside, pt able to relay POC to family Disposition Plan: Anticipate return home once work of breathing/hypoxia improved Consults called: None  Admission status: Inpatient    Tyrone Nineyan B Eagan Shifflett, MD [facility] [email or web address] Password [email or web address] 07/08/2018, 6:37 PM

## 2018-07-08 NOTE — ED Notes (Signed)
Pt able to ambulate to bathroom w/a steady gait.  NAD noted. Pt 98% on 2L via [place].

## 2018-07-08 NOTE — ED Notes (Signed)
Pt st having a headache 8/10. SHOB, weakness, chills. Pt is NAD noted at this time.

## 2018-07-08 NOTE — Telephone Encounter (Signed)
Received call from [facility] at [place] reporting positive Covid-19 results for patient.

## 2018-07-09 LAB — HIV ANTIBODY (ROUTINE TESTING W REFLEX): HIV Screen 4th Generation wRfx: NONREACTIVE

## 2018-07-09 LAB — CBC WITH DIFFERENTIAL/PLATELET
Abs Immature Granulocytes: 0.03 10*3/uL (ref 0.00–0.07)
Basophils Absolute: 0 10*3/uL (ref 0.0–0.1)
Basophils Relative: 0 %
Eosinophils Absolute: 0 10*3/uL (ref 0.0–0.5)
Eosinophils Relative: 0 %
HCT: 49.8 % (ref 39.0–52.0)
Hemoglobin: 16.6 g/dL (ref 13.0–17.0)
Immature Granulocytes: 1 %
Lymphocytes Relative: 17 %
Lymphs Abs: 0.6 10*3/uL — ABNORMAL LOW (ref 0.7–4.0)
MCH: 31.7 pg (ref 26.0–34.0)
MCHC: 33.3 g/dL (ref 30.0–36.0)
MCV: 95.2 fL (ref 80.0–100.0)
Monocytes Absolute: 0.1 10*3/uL (ref 0.1–1.0)
Monocytes Relative: 3 %
Neutro Abs: 2.8 10*3/uL (ref 1.7–7.7)
Neutrophils Relative %: 79 %
Platelets: 129 10*3/uL — ABNORMAL LOW (ref 150–400)
RBC: 5.23 MIL/uL (ref 4.22–5.81)
RDW: 12.6 % (ref 11.5–15.5)
WBC: 3.6 10*3/uL — ABNORMAL LOW (ref 4.0–10.5)
nRBC: 0 % (ref 0.0–0.2)

## 2018-07-09 LAB — COMPREHENSIVE METABOLIC PANEL
ALT: 32 U/L (ref 0–44)
AST: 30 U/L (ref 15–41)
Albumin: 4.1 g/dL (ref 3.5–5.0)
Alkaline Phosphatase: 57 U/L (ref 38–126)
Anion gap: 14 (ref 5–15)
BUN: 18 mg/dL (ref 6–20)
CO2: 21 mmol/L — ABNORMAL LOW (ref 22–32)
Calcium: 9.2 mg/dL (ref 8.9–10.3)
Chloride: 101 mmol/L (ref 98–111)
Creatinine, Ser: 0.65 mg/dL (ref 0.61–1.24)
GFR calc Af Amer: >60 mL/min (ref >60)
GFR calc non Af Amer: >60 mL/min (ref >60)
Glucose, Bld: 145 mg/dL — ABNORMAL HIGH (ref 70–99)
Potassium: 4.2 mmol/L (ref 3.5–5.1)
Sodium: 136 mmol/L (ref 135–145)
Total Bilirubin: 1.8 mg/dL — ABNORMAL HIGH (ref 0.3–1.2)
Total Protein: 8.2 g/dL — ABNORMAL HIGH (ref 6.5–8.1)

## 2018-07-09 LAB — D-DIMER, QUANTITATIVE: D-Dimer, Quant: 0.86 ug/mL-FEU — ABNORMAL HIGH (ref 0.00–0.50)

## 2018-07-09 LAB — C-REACTIVE PROTEIN: CRP: 6.2 mg/dL — ABNORMAL HIGH (ref <1.0)

## 2018-07-09 LAB — FERRITIN: Ferritin: 1456 ng/mL — ABNORMAL HIGH (ref 24–336)

## 2018-07-09 LAB — ABO/RH: ABO/RH(D): A POS

## 2018-07-09 LAB — MAGNESIUM: Magnesium: 2.2 mg/dL (ref 1.7–2.4)

## 2018-07-09 NOTE — Progress Notes (Signed)
[facility] PROGRESS NOTE    Progress Note  Ricardo Bradshaw  JXB:[id number] DOB: 1975-05-23 DOA: 07/08/2018 PCP: Marisue IvanLinthavong, [place], MD     Brief Narrative:   Ricardo Bradshaw is an 43 y.o. male past medical history obesity and asthma presented with worsening shortness of breath, his SARS-CoV-2 test was positive on 07/04/2018, he relates his symptoms of shortness of breath have progressively gotten worse so he decided to come to the ED.  07/04/2018 SARS-CoV-2 positive 07/08/2018 IV steroids  Assessment/Plan:   Acute respiratory failure with hypoxia/Acute respiratory disease due to COVID-19 virus: Symptoms started on 6.23.2020 Discontinue IV steroids, is now on room air satting greater than 95%. He was not started on a Remdesivir due to availability. Inflammatory markers are high. He says he feels great close to baseline. Ambulating check saturations on ambulation  Morbid obesity (HCC): Counseling.   DVT prophylaxis: lovenxo Family Communication:none Disposition Plan/Barrier to D/C: home 7.2.2020 Code Status:     Code Status Orders  (From admission, onward)         Start     Ordered   07/08/18 1849  Full code  Continuous     07/08/18 1848        Code Status History    This patient has a current code status but no historical code status.   Advance Care Planning Activity        IV Access:    Peripheral IV   Procedures and diagnostic studies:   Dg Chest Port 1 View  Result Date: 07/08/2018 CLINICAL DATA:  Increasing shortness of breath.  COVID-19 positive. EXAM: PORTABLE CHEST 1 VIEW COMPARISON:  None. FINDINGS: The study is limited due to the low volume portable technique. The heart, hila, and mediastinum are normal. No pneumothorax. Suspected mild opacities in the periphery of the left lung. The right lung is clear. No other acute abnormalities. IMPRESSION: Suspected mild opacities in the periphery of the left lung. No other abnormalities.  Limited study due to portable technique. Electronically Signed   By: Gerome Samavid  Williams III M.D   On: 07/08/2018 13:36     Medical Consultants:    None.  Anti-Infectives:   None  Subjective:    Ricardo Bradshaw relates his breathing is much improved compared to yesterday.  Objective:    Vitals:   07/08/18 1836 07/08/18 2037 07/09/18 0516  BP: (!) 144/71 134/74   Pulse:  94   Resp: 18    Temp: 98.8 F (37.1 C) 99.5 F (37.5 C) 97.8 F (36.6 C)  TempSrc: Oral Oral Oral  SpO2: 97%     No intake or output data in the 24 hours ending 07/09/18 0757 There were no vitals filed for this visit.  Exam: General exam: In no acute distress. Respiratory system: Good air movement and clear to auscultation. Cardiovascular system: S1 & S2 heard, RRR. No JVD. Gastrointestinal system: Abdomen is nondistended, soft and nontender.  Central nervous system: Alert and oriented. No focal neurological deficits. Extremities: No pedal edema. Skin: No rashes, lesions or ulcers Psychiatry: Judgement and insight appear normal. Mood & affect appropriate.   Data Reviewed:    Labs: Basic Metabolic Panel: Recent Labs  Lab 07/08/18 1340 07/09/18 0215  NA 135 136  K 3.7 4.2  CL 100 101  CO2 23 21*  GLUCOSE 119* 145*  BUN 12 18  CREATININE 0.69 0.65  CALCIUM 8.7* 9.2  MG  --  2.2   GFR Estimated Creatinine Clearance: 180.7 mL/min (by C-G formula  based on SCr of 0.65 mg/dL). Liver Function Tests: Recent Labs  Lab 07/08/18 1340 07/09/18 0215  AST 31 30  ALT 32 32  ALKPHOS 60 57  BILITOT 1.8* 1.8*  PROT 7.7 8.2*  ALBUMIN 3.9 4.1   No results for input(s): LIPASE, AMYLASE in the last 168 hours. No results for input(s): AMMONIA in the last 168 hours. Coagulation profile No results for input(s): INR, PROTIME in the last 168 hours. COVID-19 Labs  Recent Labs    07/08/18 1340 07/09/18 0215  FERRITIN 1,475* 1,456*  LDH 315*  --   CRP 5.9* 6.2*    Lab Results  Component  Value Date   SARSCOV2NAA Detected (A) 07/04/2018    CBC: Recent Labs  Lab 07/08/18 1340 07/09/18 0215  WBC 5.4 3.6*  NEUTROABS 4.0 2.8  HGB 15.8 16.6  HCT 45.7 49.8  MCV 92.3 95.2  PLT 132* 129*   Cardiac Enzymes: No results for input(s): CKTOTAL, CKMB, CKMBINDEX, TROPONINI in the last 168 hours. BNP (last 3 results) No results for input(s): PROBNP in the last 8760 hours. CBG: No results for input(s): GLUCAP in the last 168 hours. D-Dimer: No results for input(s): DDIMER in the last 72 hours. Hgb A1c: No results for input(s): HGBA1C in the last 72 hours. Lipid Profile: Recent Labs    07/08/18 1322  TRIG 76   Thyroid function studies: No results for input(s): TSH, T4TOTAL, T3FREE, THYROIDAB in the last 72 hours.  Invalid input(s): FREET3 Anemia work up: Recent Labs    07/08/18 1340 07/09/18 0215  FERRITIN 1,475* 1,456*   Sepsis Labs: Recent Labs  Lab 07/08/18 1322 07/08/18 1340 07/09/18 0215  PROCALCITON  --  <0.10  --   WBC  --  5.4 3.6*  LATICACIDVEN 1.3  --   --    Microbiology Recent Results (from the past 240 hour(s))  Novel Coronavirus, NAA ([facility])     Status: Abnormal   Collection Time: 07/04/18 10:13 AM  Result Value Ref Range Status   SARS-CoV-2, NAA Detected (A) Not Detected Final    Comment: Testing was performed using the cobas(R) SARS-CoV-2 test. This test was developed and its performance characteristics determined by [employer]. This test has not been FDA cleared or approved. This test has been authorized by FDA under an Emergency Use Authorization (EUA). This test is only authorized for the duration of time the declaration that circumstances exist justifying the authorization of the emergency use of in vitro diagnostic tests for detection of SARS-CoV-2 virus and/or diagnosis of COVID-19 infection under section 564(b)(1) of the Act, 21 U.S.C. 528UXL-2(G)(4), unless the authorization is terminated or revoked sooner. When  diagnostic testing is negative, the possibility of a false negative result should be considered in the context of a patient's recent exposures and the presence of clinical signs and symptoms consistent with COVID-19. An individual without symptoms of COVID-19 and who is not shedding SARS-CoV-2 virus would expect to have a negati ve (not detected) result in this assay.   Blood Culture (routine x 2)     Status: None (Preliminary result)   Collection Time: 07/08/18  1:22 PM   Specimen: BLOOD  Result Value Ref Range Status   Specimen Description BLOOD BLOOD LEFT FOREARM  Final   Special Requests   Final    BOTTLES DRAWN AEROBIC AND ANAEROBIC Blood Culture results may not be optimal due to an excessive volume of blood received in culture bottles   Culture   Final    NO GROWTH <  24 HOURS Performed at [facility], [address] [place], [place] [address]    Report Status PENDING  Incomplete  Blood Culture (routine x 2)     Status: None (Preliminary result)   Collection Time: 07/08/18  1:40 PM   Specimen: BLOOD  Result Value Ref Range Status   Specimen Description BLOOD RIGHT ANTECUBITAL  Final   Special Requests   Final    BOTTLES DRAWN AEROBIC AND ANAEROBIC Blood Culture adequate volume   Culture   Final    NO GROWTH < 24 HOURS Performed at [facility], [address] [place], [place] [address]    Report Status PENDING  Incomplete     Medications:    enoxaparin (LOVENOX) injection  80 mg Subcutaneous Q24H   vitamin C  500 mg Oral Daily   zinc sulfate  220 mg Oral Daily   Continuous Infusions:    LOS: 1 day   Marinda ElkAbraham Feliz Ortiz  [facility]  07/09/2018, 7:57 AM

## 2018-07-09 NOTE — Progress Notes (Signed)
SATURATION QUALIFICATIONS: (This note is used to comply with regulatory documentation for home oxygen)  Patient Saturations on Room Air at Rest = 97%  Patient Saturations on Room Air while Ambulating = 93%    Please briefly explain why patient needs home oxygen: Does not qualify

## 2018-07-10 LAB — CBC WITH DIFFERENTIAL/PLATELET
Abs Immature Granulocytes: 0.04 10*3/uL (ref 0.00–0.07)
Basophils Absolute: 0 10*3/uL (ref 0.0–0.1)
Basophils Relative: 0 %
Eosinophils Absolute: 0 10*3/uL (ref 0.0–0.5)
Eosinophils Relative: 0 %
HCT: 44.6 % (ref 39.0–52.0)
Hemoglobin: 15.3 g/dL (ref 13.0–17.0)
Immature Granulocytes: 1 %
Lymphocytes Relative: 12 %
Lymphs Abs: 1 10*3/uL (ref 0.7–4.0)
MCH: 32.3 pg (ref 26.0–34.0)
MCHC: 34.3 g/dL (ref 30.0–36.0)
MCV: 94.1 fL (ref 80.0–100.0)
Monocytes Absolute: 0.4 10*3/uL (ref 0.1–1.0)
Monocytes Relative: 5 %
Neutro Abs: 7 10*3/uL (ref 1.7–7.7)
Neutrophils Relative %: 82 %
Platelets: 162 10*3/uL (ref 150–400)
RBC: 4.74 MIL/uL (ref 4.22–5.81)
RDW: 12.5 % (ref 11.5–15.5)
WBC: 8.5 10*3/uL (ref 4.0–10.5)
nRBC: 0 % (ref 0.0–0.2)

## 2018-07-10 LAB — COMPREHENSIVE METABOLIC PANEL
ALT: 30 U/L (ref 0–44)
AST: 31 U/L (ref 15–41)
Albumin: 3.6 g/dL (ref 3.5–5.0)
Alkaline Phosphatase: 52 U/L (ref 38–126)
Anion gap: 15 (ref 5–15)
BUN: 21 mg/dL — ABNORMAL HIGH (ref 6–20)
CO2: 24 mmol/L (ref 22–32)
Calcium: 8.7 mg/dL — ABNORMAL LOW (ref 8.9–10.3)
Chloride: 99 mmol/L (ref 98–111)
Creatinine, Ser: 0.74 mg/dL (ref 0.61–1.24)
GFR calc Af Amer: >60 mL/min (ref >60)
GFR calc non Af Amer: >60 mL/min (ref >60)
Glucose, Bld: 102 mg/dL — ABNORMAL HIGH (ref 70–99)
Potassium: 3.8 mmol/L (ref 3.5–5.1)
Sodium: 138 mmol/L (ref 135–145)
Total Bilirubin: 1.1 mg/dL (ref 0.3–1.2)
Total Protein: 7.3 g/dL (ref 6.5–8.1)

## 2018-07-10 LAB — D-DIMER, QUANTITATIVE: D-Dimer, Quant: 0.78 ug/mL-FEU — ABNORMAL HIGH (ref 0.00–0.50)

## 2018-07-10 LAB — C-REACTIVE PROTEIN: CRP: 3.9 mg/dL — ABNORMAL HIGH (ref <1.0)

## 2018-07-10 LAB — FERRITIN: Ferritin: 1339 ng/mL — ABNORMAL HIGH (ref 24–336)

## 2018-07-10 LAB — MAGNESIUM: Magnesium: 1.8 mg/dL (ref 1.7–2.4)

## 2018-07-10 NOTE — Progress Notes (Addendum)
Patient and his father voiced concerns regarding the patient returning to home being Covid (+), because the other household members tested negative. Patient's father verbalized the expectation that the hospital would allow Mr. Ricardo Bradshaw to remain in the hospital until he was no longer required to be quarantined and provide him with transportation to his car. Patient and family were made aware thatbecause he no longer requires acute care he will be released from the hospital, also discussed current guidelines for quarantine. Patient verbalized understanding and will work to find transportation and housing.   Charge nurse notified. Social work contacted.   AVS reviewed with patient and all questions answered.

## 2018-07-10 NOTE — Progress Notes (Signed)
[facility] has been called to transport patient, [facility] forms printed on [employer].   CSW signing off.   [place], [place]

## 2018-07-10 NOTE — Progress Notes (Signed)
CSW spoke with patient's father Ricardo Bradshaw at [phone]. Richard reports family is able to figure out getting patient's car from [place] informed Ricardo Bradshaw that patient will be transported home. Richard reported concerns about quarantine as family has tested negative for COVID and they live in a small home. Richard reports it being okay for patient to be transported home via [facility] today, and that family is working on possibly getting patient a hotel to stay in the next few weeks to quarantine. CSW informed Ricardo Bradshaw that patient should be discharging within the next hour or so after [facility] is called, Richard expressed understanding.   [employer], [place]

## 2018-07-10 NOTE — Discharge Summary (Addendum)
Physician Discharge Summary  Ricardo Bradshaw OZH:[id number] DOB: [birth year] DOA: 07/08/2018  PCP: Dion Body, MD  Admit date: 07/08/2018 Discharge date: 07/10/2018  Admitted From: Home Disposition:  Home  Recommendations for Outpatient Follow-up:  1. Follow up with PCP in 1-2 weeks 2. Please obtain BMP/CBC in one week   Home Health:No Equipment/Devices:None  Discharge Condition:stable CODE STATUS:Full Diet recommendation: Heart Healthy   Brief/Interim Summary: 43 y.o. male past medical history obesity and asthma presented with worsening shortness of breath, his SARS-CoV-2 test was positive on 07/04/2018, he relates his symptoms of shortness of breath have progressively gotten worse so he decided to come to the ED.  Discharge Diagnoses:  Active Problems:   Acute respiratory failure with hypoxia (HCC)   Acute respiratory disease due to COVID-19 virus   Morbid obesity ([place]) Noted on 07/01/2018, initially he required oxygen try to keep his saturation above 90% he was started on IV steroids, he prolonged at night and his saturations improved greater than 95% on room air. Steroids were discontinued he was watched for an additional 24 hours and saturations remain greater than 95% on room air. He was ambulated and saturations remained stable. Markers drastically improved and she was discharged in stable condition. He was observed for more than 30 hours and his saturations remained greater than 95%.   Discharge Instructions  Discharge Instructions    Diet - low sodium heart healthy   Complete by: As directed    Increase activity slowly   Complete by: As directed      Allergies as of 07/10/2018      Reactions   Pollen Extract    allergies       Medication List    You have not been prescribed any medications.     Allergies  Allergen Reactions  . Pollen Extract     allergies     Consultations:  None   Procedures/Studies: Dg Chest Port 1 View  Result  Date: 07/08/2018 CLINICAL DATA:  Increasing shortness of breath.  COVID-19 positive. EXAM: PORTABLE CHEST 1 VIEW COMPARISON:  None. FINDINGS: The study is limited due to the low volume portable technique. The heart, hila, and mediastinum are normal. No pneumothorax. Suspected mild opacities in the periphery of the left lung. The right lung is clear. No other acute abnormalities. IMPRESSION: Suspected mild opacities in the periphery of the left lung. No other abnormalities. Limited study due to portable technique. Electronically Signed   By: Dorise Bullion III M.D   On: 07/08/2018 13:36     Subjective: No complaints feels great wants to go home.  Discharge Exam: Vitals:   07/10/18 0800 07/10/18 0839  BP: (!) 107/51   Pulse:    Resp:  16  Temp:  98.5 F (36.9 C)  SpO2:     Vitals:   07/09/18 2000 07/10/18 0500 07/10/18 0800 07/10/18 0839  BP: 130/67 (!) 107/54 (!) 107/51   Pulse: 80 93    Resp: 18 16  16   Temp: 99.2 F (37.3 C) 98.5 F (36.9 C)  98.5 F (36.9 C)  TempSrc: Oral Oral  Oral  SpO2: 99% 96%    Weight:      Height:        General: Pt is alert, awake, not in acute distress Cardiovascular: RRR, S1/S2 +, no rubs, no gallops Respiratory: CTA bilaterally, no wheezing, no rhonchi Abdominal: Soft, NT, ND, bowel sounds + Extremities: no edema, no cyanosis    The results of significant diagnostics from this  hospitalization (including imaging, microbiology, ancillary and laboratory) are listed below for reference.     Microbiology: Recent Results (from the past 240 hour(s))  Novel Coronavirus, NAA ([facility])     Status: Abnormal   Collection Time: 07/04/18 10:13 AM  Result Value Ref Range Status   SARS-CoV-2, NAA Detected (A) Not Detected Final    Comment: Testing was performed using the cobas(R) SARS-CoV-2 test. This test was developed and its performance characteristics determined by [facility]. This test has not been FDA cleared or approved. This test  has been authorized by FDA under an Emergency Use Authorization (EUA). This test is only authorized for the duration of time the declaration that circumstances exist justifying the authorization of the emergency use of in vitro diagnostic tests for detection of SARS-CoV-2 virus and/or diagnosis of COVID-19 infection under section 564(b)(1) of the Act, 21 U.S.C. 161WRU-0(A)(5360bbb-3(b)(1), unless the authorization is terminated or revoked sooner. When diagnostic testing is negative, the possibility of a false negative result should be considered in the context of a patient's recent exposures and the presence of clinical signs and symptoms consistent with COVID-19. An individual without symptoms of COVID-19 and who is not shedding SARS-CoV-2 virus would expect to have a negati ve (not detected) result in this assay.   Blood Culture (routine x 2)     Status: None (Preliminary result)   Collection Time: 07/08/18  1:22 PM   Specimen: BLOOD  Result Value Ref Range Status   Specimen Description BLOOD BLOOD LEFT FOREARM  Final   Special Requests   Final    BOTTLES DRAWN AEROBIC AND ANAEROBIC Blood Culture results may not be optimal due to an excessive volume of blood received in culture bottles   Culture   Final    NO GROWTH 2 DAYS Performed at [facility], [address] [place], [place] [address]    Report Status PENDING  Incomplete  Blood Culture (routine x 2)     Status: None (Preliminary result)   Collection Time: 07/08/18  1:40 PM   Specimen: BLOOD  Result Value Ref Range Status   Specimen Description BLOOD RIGHT ANTECUBITAL  Final   Special Requests   Final    BOTTLES DRAWN AEROBIC AND ANAEROBIC Blood Culture adequate volume   Culture   Final    NO GROWTH 2 DAYS Performed at [facility], [address] [place], [place] [address]    Report Status PENDING  Incomplete     Labs: BNP (last 3 results) No results for input(s): BNP in the last 8760 hours. Basic  Metabolic Panel: Recent Labs  Lab 07/08/18 1340 07/09/18 0215 07/10/18 0300  NA 135 136 138  K 3.7 4.2 3.8  CL 100 101 99  CO2 23 21* 24  GLUCOSE 119* 145* 102*  BUN 12 18 21*  CREATININE 0.69 0.65 0.74  CALCIUM 8.7* 9.2 8.7*  MG  --  2.2 1.8   Liver Function Tests: Recent Labs  Lab 07/08/18 1340 07/09/18 0215 07/10/18 0300  AST 31 30 31   ALT 32 32 30  ALKPHOS 60 57 52  BILITOT 1.8* 1.8* 1.1  PROT 7.7 8.2* 7.3  ALBUMIN 3.9 4.1 3.6   No results for input(s): LIPASE, AMYLASE in the last 168 hours. No results for input(s): AMMONIA in the last 168 hours. CBC: Recent Labs  Lab 07/08/18 1340 07/09/18 0215 07/10/18 0300  WBC 5.4 3.6* 8.5  NEUTROABS 4.0 2.8 7.0  HGB 15.8 16.6 15.3  HCT 45.7 49.8 44.6  MCV 92.3 95.2 94.1  PLT 132* 129* 162   Cardiac Enzymes: No results for input(s): CKTOTAL, CKMB, CKMBINDEX, TROPONINI in the last 168 hours. BNP: Invalid input(s): POCBNP CBG: No results for input(s): GLUCAP in the last 168 hours. D-Dimer Recent Labs    07/09/18 0215 07/10/18 0300  DDIMER 0.86* 0.78*   Hgb A1c No results for input(s): HGBA1C in the last 72 hours. Lipid Profile Recent Labs    07/08/18 1322  TRIG 76   Thyroid function studies No results for input(s): TSH, T4TOTAL, T3FREE, THYROIDAB in the last 72 hours.  Invalid input(s): FREET3 Anemia work up [facility]    07/09/18 0215 07/10/18 0355  FERRITIN 1,456* 1,339*   Urinalysis No results found for: COLORURINE, APPEARANCEUR, LABSPEC, PHURINE, GLUCOSEU, HGBUR, BILIRUBINUR, KETONESUR, PROTEINUR, UROBILINOGEN, NITRITE, LEUKOCYTESUR Sepsis Labs Invalid input(s): PROCALCITONIN,  WBC,  LACTICIDVEN Microbiology Recent Results (from the past 240 hour(s))  Novel Coronavirus, NAA ([facility])     Status: Abnormal   Collection Time: 07/04/18 10:13 AM  Result Value Ref Range Status   SARS-CoV-2, NAA Detected (A) Not Detected Final    Comment: Testing was performed using the cobas(R) SARS-CoV-2  test. This test was developed and its performance characteristics determined by [facility]. This test has not been FDA cleared or approved. This test has been authorized by FDA under an Emergency Use Authorization (EUA). This test is only authorized for the duration of time the declaration that circumstances exist justifying the authorization of the emergency use of in vitro diagnostic tests for detection of SARS-CoV-2 virus and/or diagnosis of COVID-19 infection under section 564(b)(1) of the Act, 21 U.S.C. 295MWU-1(L)(2360bbb-3(b)(1), unless the authorization is terminated or revoked sooner. When diagnostic testing is negative, the possibility of a false negative result should be considered in the context of a patient's recent exposures and the presence of clinical signs and symptoms consistent with COVID-19. An individual without symptoms of COVID-19 and who is not shedding SARS-CoV-2 virus would expect to have a negati ve (not detected) result in this assay.   Blood Culture (routine x 2)     Status: None (Preliminary result)   Collection Time: 07/08/18  1:22 PM   Specimen: BLOOD  Result Value Ref Range Status   Specimen Description BLOOD BLOOD LEFT FOREARM  Final   Special Requests   Final    BOTTLES DRAWN AEROBIC AND ANAEROBIC Blood Culture results may not be optimal due to an excessive volume of blood received in culture bottles   Culture   Final    NO GROWTH 2 DAYS Performed at [facility], [address] [place], [place] [address]    Report Status PENDING  Incomplete  Blood Culture (routine x 2)     Status: None (Preliminary result)   Collection Time: 07/08/18  1:40 PM   Specimen: BLOOD  Result Value Ref Range Status   Specimen Description BLOOD RIGHT ANTECUBITAL  Final   Special Requests   Final    BOTTLES DRAWN AEROBIC AND ANAEROBIC Blood Culture adequate volume   Culture   Final    NO GROWTH 2 DAYS Performed at [facility], [address]  [place], [place] [address]    Report Status PENDING  Incomplete     Time coordinating discharge: Over 30 minutes  SIGNED:   Marinda ElkAbraham Feliz Ortiz, MD  [facility] 07/10/2018, 9:55 AM Pager   If 7PM-7AM, please contact night-coverage [email or web address] Password [email or web address]

## 2018-07-13 LAB — CULTURE, BLOOD (ROUTINE X 2)
Culture: NO GROWTH
Culture: NO GROWTH
Special Requests: ADEQUATE

## 2018-10-06 IMAGING — US US SCROTUM
1 series · 13 of 25 positions shown · non-contrast
Comparison: Ultrasound 08/27/2013.

CLINICAL DATA: Left testicular swelling.  Pain.

EXAM:
SCROTAL ULTRASOUND
DOPPLER ULTRASOUND OF THE TESTICLES
TECHNIQUE: Complete ultrasound examination of the testicles, epididymis, and
other scrotal structures was performed. Color and spectral Doppler
ultrasound were also utilized to evaluate blood flow to the
testicles.

[Series 1: us scrotum · 0.07mm/px · 13 of 77 slices shown]
[im 1/77]
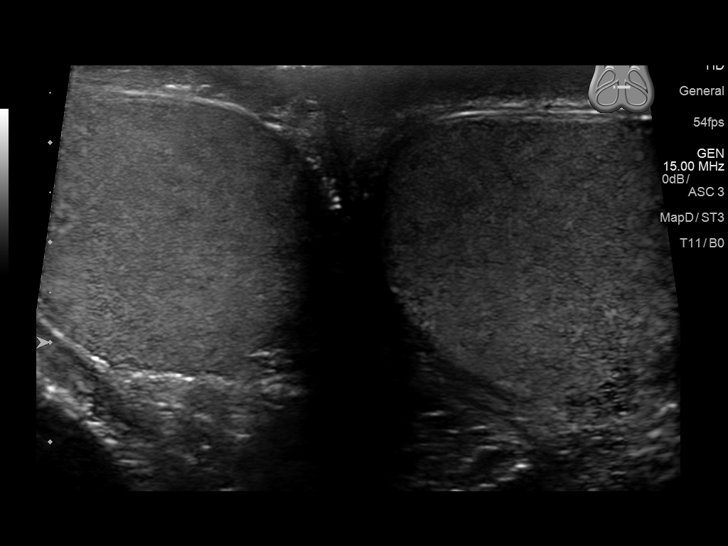
[im 7/77]
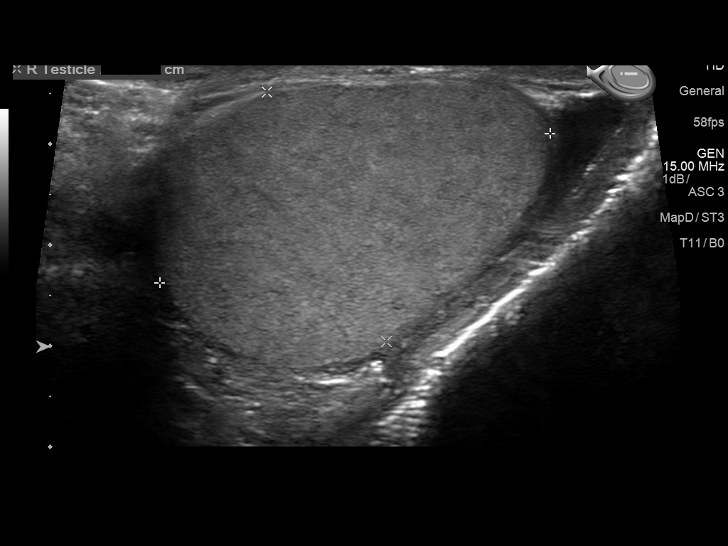
[im 13/77]
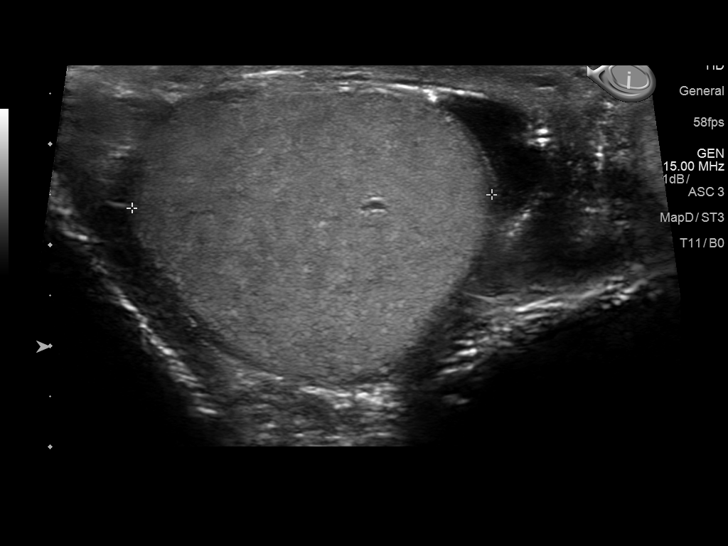
[im 20/77]
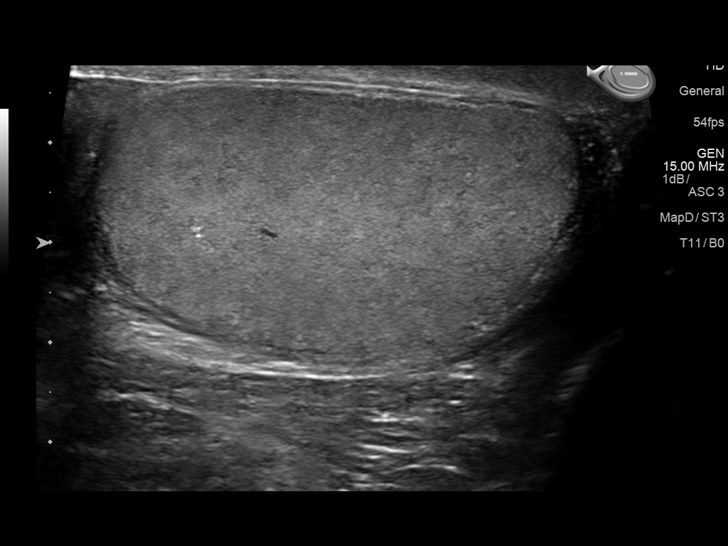
[im 26/77]
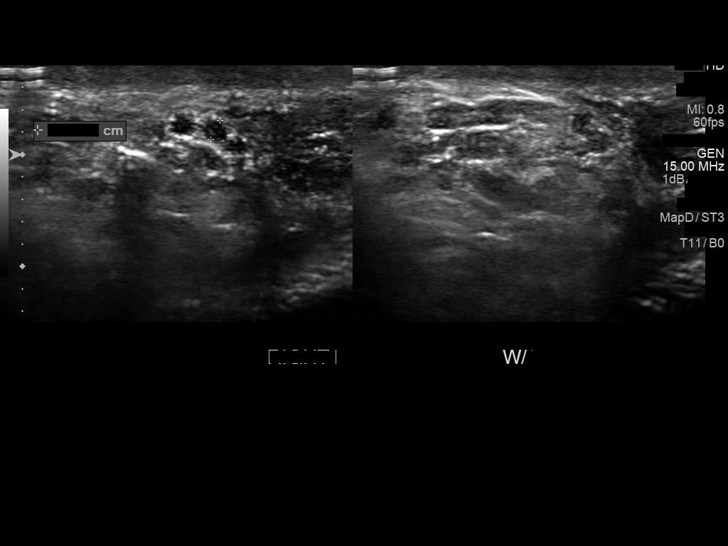
[im 32/77]
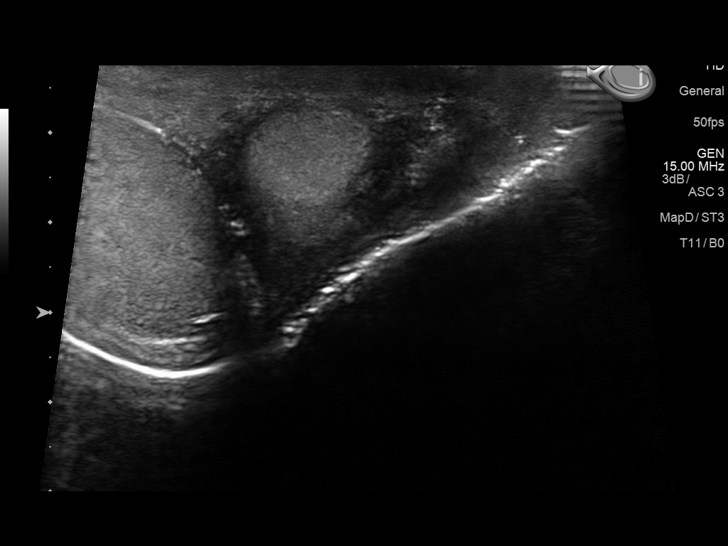
[im 39/77]
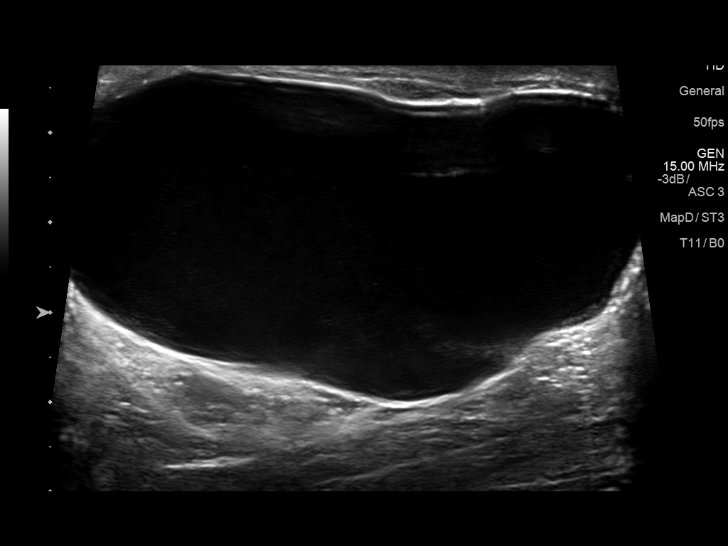
[im 45/77]
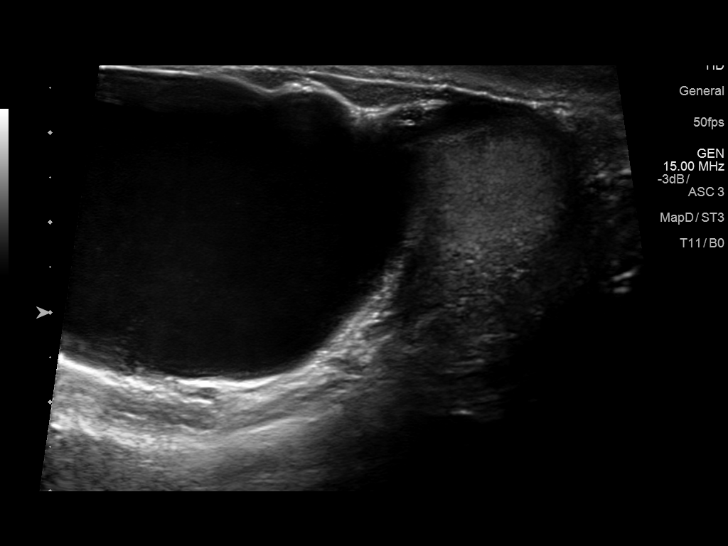
[im 51/77]
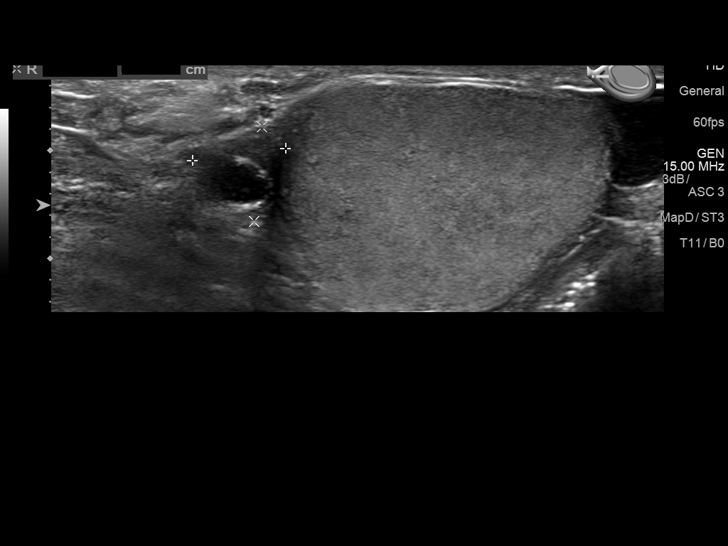
[im 58/77]
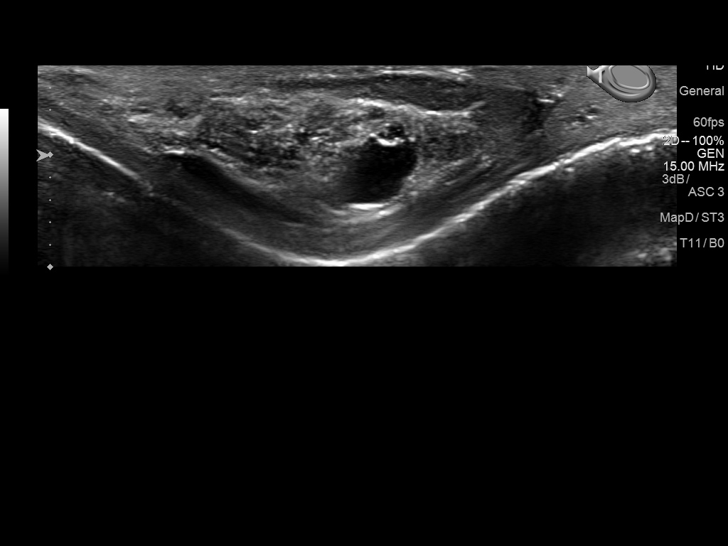
[im 64/77]
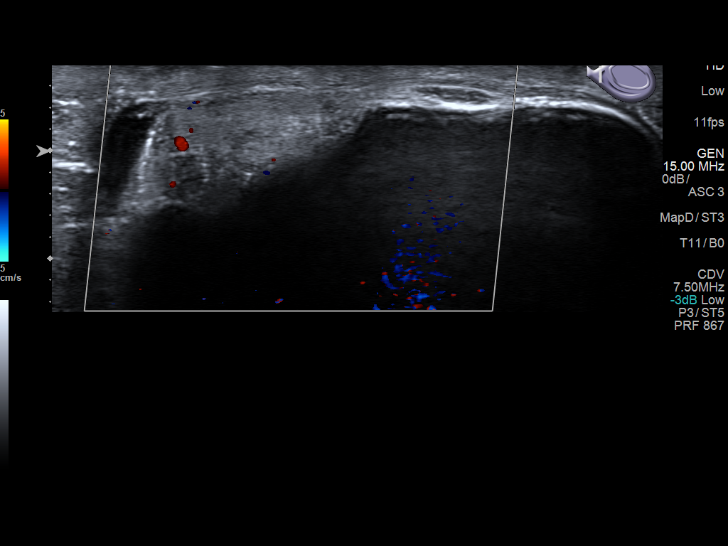
[im 70/77]
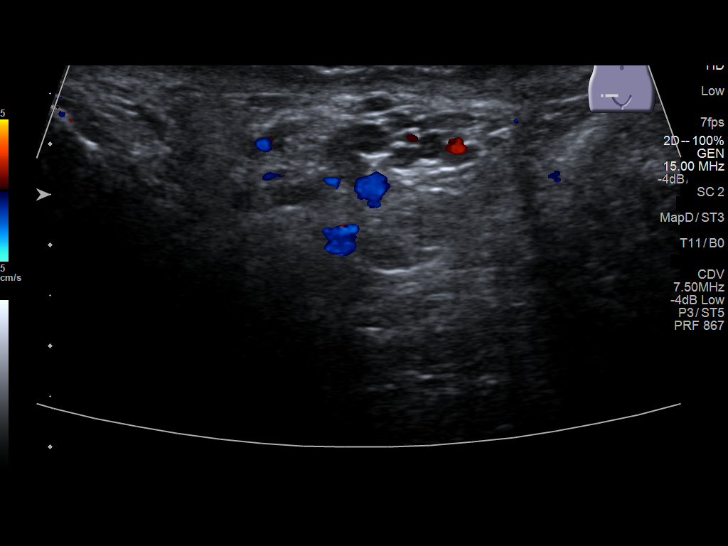
[im 77/77]
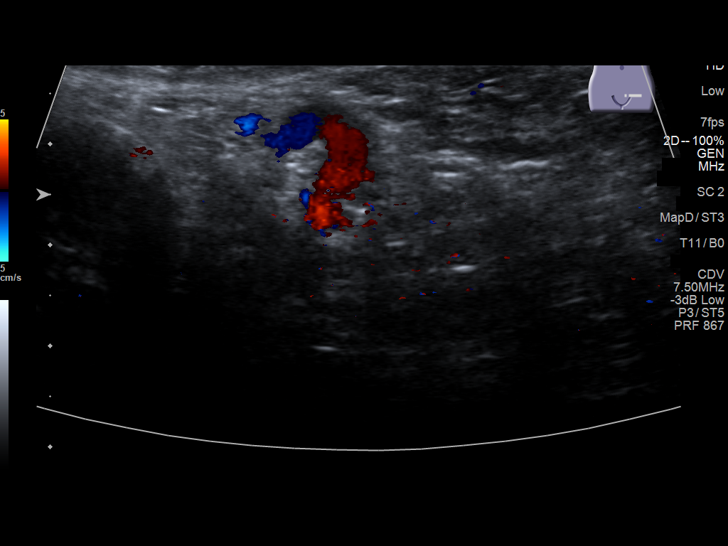

[13 of 25 positions shown; findings below may reference images not displayed]

FINDINGS: Right testicle

Measurements: 4.1 x 2.7 x 3.6 cm. No mass or microlithiasis
visualized.

Left testicle

Measurements: 4.8 x 2.7 x 3.2 cm. No mass. Tiny benign appearing
calcifications noted.

Right epididymis:  7 mm epididymal cyst.

Left epididymis: 6.7 x 3.4 x 5.4 cm large cyst is again noted
consist with large epididymal cyst versus spermatocele.

Hydrocele:  Small bilateral hydroceles.

Varicocele: Tiny bilateral inguinal canal varicoceles. Questionable
right inguinal canal small hernia with herniation of fat.

Pulsed Doppler interrogation of both testes demonstrates normal low
resistance arterial and venous waveforms bilaterally.
IMPRESSION: 1.  No evidence of testicular mass or torsion.

2. Large cyst on the left again noted consistent with large
epididymal cyst versus spermatocele. Similar finding noted on prior
exam.

3. Tiny bilateral inguinal canal varicoceles. Questionable right
inguinal canal small hernia with herniation of fat.
